# Patient Record
Sex: Female | Born: 1980 | Race: Black or African American | Hispanic: No | Marital: Single | Smoking: Never smoker
Health system: Southern US, Community
[De-identification: ages and names within clinical notes are randomized; demographics above are authoritative.]

## PROBLEM LIST (undated history)

## (undated) DIAGNOSIS — Z8619 Personal history of other infectious and parasitic diseases: Secondary | ICD-10-CM

## (undated) DIAGNOSIS — R569 Unspecified convulsions: Secondary | ICD-10-CM

## (undated) DIAGNOSIS — D649 Anemia, unspecified: Secondary | ICD-10-CM

## (undated) DIAGNOSIS — N83201 Unspecified ovarian cyst, right side: Secondary | ICD-10-CM

## (undated) HISTORY — DX: Personal history of other infectious and parasitic diseases: Z86.19

## (undated) HISTORY — DX: Anemia, unspecified: D64.9

## (undated) HISTORY — DX: Unspecified ovarian cyst, right side: N83.201

---

## 2005-10-17 HISTORY — PX: OTHER SURGICAL HISTORY: SHX169

## 2010-10-17 NOTE — L&D Delivery Note (Signed)
Delivery Note Pt progressed and received an epidural.  She reached complete and pushed very well.  At 10:33 PM a viable and healthy female was delivered via Vaginal, Spontaneous Delivery (Presentation: Right Occiput Anterior).  APGAR: 9, 9; weight pending .   Placenta status: Intact, Spontaneous.  Cord: 3 vessels with the following complications: None.   Anesthesia: Epidural  Episiotomy: None Lacerations: None Est. Blood Loss (mL): 300  Mom to postpartum.  Baby to nursery-stable.  Kreston Ahrendt D 08/01/2011, 10:55 PM

## 2010-11-17 DIAGNOSIS — Z8619 Personal history of other infectious and parasitic diseases: Secondary | ICD-10-CM

## 2010-11-17 HISTORY — DX: Personal history of other infectious and parasitic diseases: Z86.19

## 2010-12-18 ENCOUNTER — Emergency Department (HOSPITAL_COMMUNITY)
Admission: EM | Admit: 2010-12-18 | Discharge: 2010-12-18 | Disposition: A | Discharge status: Home / Self Care | Payer: Medicaid Other | Attending: Emergency Medicine | Admitting: Emergency Medicine

## 2010-12-18 DIAGNOSIS — O209 Hemorrhage in early pregnancy, unspecified: Secondary | ICD-10-CM | POA: Insufficient documentation

## 2010-12-18 LAB — WET PREP, GENITAL
Trich, Wet Prep: NONE SEEN
Yeast Wet Prep HPF POC: NONE SEEN

## 2010-12-18 LAB — URINE MICROSCOPIC-ADD ON

## 2010-12-18 LAB — URINALYSIS, ROUTINE W REFLEX MICROSCOPIC
Bilirubin Urine: NEGATIVE
Glucose, UA: NEGATIVE mg/dL
Ketones, ur: NEGATIVE mg/dL
pH: 6.5 (ref 5.0–8.0)

## 2010-12-20 LAB — GC/CHLAMYDIA PROBE AMP, GENITAL
Chlamydia, DNA Probe: NEGATIVE
GC Probe Amp, Genital: NEGATIVE

## 2011-01-11 LAB — ABO/RH

## 2011-01-11 LAB — RPR: RPR: NONREACTIVE

## 2011-01-11 LAB — HEPATITIS B SURFACE ANTIGEN: Hepatitis B Surface Ag: NEGATIVE

## 2011-07-29 ENCOUNTER — Telehealth (HOSPITAL_COMMUNITY): Payer: Self-pay | Admitting: *Deleted

## 2011-07-29 ENCOUNTER — Encounter (HOSPITAL_COMMUNITY): Payer: Self-pay | Admitting: *Deleted

## 2011-07-29 NOTE — Telephone Encounter (Signed)
Preadmission screen  

## 2011-08-01 ENCOUNTER — Other Ambulatory Visit: Payer: Self-pay | Admitting: Obstetrics and Gynecology

## 2011-08-01 ENCOUNTER — Encounter (HOSPITAL_COMMUNITY): Payer: Self-pay | Admitting: *Deleted

## 2011-08-01 ENCOUNTER — Inpatient Hospital Stay (HOSPITAL_COMMUNITY): Payer: Medicaid Other | Admitting: Anesthesiology

## 2011-08-01 ENCOUNTER — Inpatient Hospital Stay (HOSPITAL_COMMUNITY)
Admission: AD | Admit: 2011-08-01 | Discharge: 2011-08-03 | DRG: 775 | Disposition: A | Discharge status: Home / Self Care | Payer: Medicaid Other | Source: Ambulatory Visit | Attending: Obstetrics and Gynecology | Admitting: Obstetrics and Gynecology

## 2011-08-01 ENCOUNTER — Encounter (HOSPITAL_COMMUNITY): Payer: Self-pay | Admitting: Anesthesiology

## 2011-08-01 DIAGNOSIS — O4290 Premature rupture of membranes, unspecified as to length of time between rupture and onset of labor, unspecified weeks of gestation: Secondary | ICD-10-CM | POA: Diagnosis present

## 2011-08-01 LAB — CBC
HCT: 36.4 % (ref 36.0–46.0)
Hemoglobin: 12.1 g/dL (ref 12.0–15.0)
MCH: 30.7 pg (ref 26.0–34.0)
RBC: 3.94 MIL/uL (ref 3.87–5.11)

## 2011-08-01 LAB — RPR: RPR Ser Ql: NONREACTIVE

## 2011-08-01 MED ORDER — LIDOCAINE HCL 1.5 % IJ SOLN
INTRAMUSCULAR | Status: DC | PRN
  Administered 2011-08-01: 2 mL via INTRADERMAL
  Administered 2011-08-01 (×2): 5 mL via INTRADERMAL

## 2011-08-01 MED ORDER — OXYCODONE-ACETAMINOPHEN 5-325 MG PO TABS: 2.0000 | ORAL_TABLET | ORAL | Status: DC | PRN

## 2011-08-01 MED ORDER — ONDANSETRON HCL 4 MG/2ML IJ SOLN: 4.0000 mg | Freq: Four times a day (QID) | INTRAMUSCULAR | Status: DC | PRN

## 2011-08-01 MED ORDER — LACTATED RINGERS IV SOLN
INTRAVENOUS | Status: DC
  Administered 2011-08-01: 18:00:00 via INTRAVENOUS

## 2011-08-01 MED ORDER — OXYTOCIN BOLUS FROM INFUSION
500.0000 mL | Freq: Once | INTRAVENOUS | Status: DC
  Filled 2011-08-01: qty 500
  Filled 2011-08-01: qty 1000

## 2011-08-01 MED ORDER — PHENYLEPHRINE 40 MCG/ML (10ML) SYRINGE FOR IV PUSH (FOR BLOOD PRESSURE SUPPORT)
80.0000 ug | PREFILLED_SYRINGE | INTRAVENOUS | Status: DC | PRN
Start: 2011-08-01 — End: 2011-08-02
  Filled 2011-08-01: qty 5

## 2011-08-01 MED ORDER — EPHEDRINE 5 MG/ML INJ
10.0000 mg | INTRAVENOUS | Status: DC | PRN
  Filled 2011-08-01: qty 4

## 2011-08-01 MED ORDER — EPHEDRINE 5 MG/ML INJ
10.0000 mg | INTRAVENOUS | Status: DC | PRN
  Filled 2011-08-01 (×2): qty 4

## 2011-08-01 MED ORDER — LIDOCAINE HCL (PF) 1 % IJ SOLN
30.0000 mL | INTRAMUSCULAR | Status: DC | PRN
  Filled 2011-08-01 (×2): qty 30

## 2011-08-01 MED ORDER — OXYTOCIN 20 UNITS IN LACTATED RINGERS INFUSION - SIMPLE: 125.0000 mL/h | Freq: Once | INTRAVENOUS | Status: DC

## 2011-08-01 MED ORDER — DIPHENHYDRAMINE HCL 50 MG/ML IJ SOLN: 12.5000 mg | INTRAMUSCULAR | Status: DC | PRN

## 2011-08-01 MED ORDER — PHENYLEPHRINE 40 MCG/ML (10ML) SYRINGE FOR IV PUSH (FOR BLOOD PRESSURE SUPPORT)
80.0000 ug | PREFILLED_SYRINGE | INTRAVENOUS | Status: DC | PRN
  Filled 2011-08-01 (×2): qty 5

## 2011-08-01 MED ORDER — OXYTOCIN 10 UNIT/ML IJ SOLN
INTRAMUSCULAR | Status: AC
  Filled 2011-08-01: qty 2

## 2011-08-01 MED ORDER — CITRIC ACID-SODIUM CITRATE 334-500 MG/5ML PO SOLN: 30.0000 mL | ORAL | Status: DC | PRN

## 2011-08-01 MED ORDER — LACTATED RINGERS IV SOLN: 500.0000 mL | Freq: Once | INTRAVENOUS | Status: DC

## 2011-08-01 MED ORDER — ACETAMINOPHEN 325 MG PO TABS: 650.0000 mg | ORAL_TABLET | ORAL | Status: DC | PRN

## 2011-08-01 MED ORDER — LACTATED RINGERS IV SOLN: 500.0000 mL | INTRAVENOUS | Status: DC | PRN

## 2011-08-01 MED ORDER — FENTANYL 2.5 MCG/ML BUPIVACAINE 1/10 % EPIDURAL INFUSION (WH - ANES)
14.0000 mL/h | INTRAMUSCULAR | Status: DC
  Administered 2011-08-01: 14 mL/h via EPIDURAL
  Filled 2011-08-01 (×2): qty 60

## 2011-08-01 MED ORDER — IBUPROFEN 600 MG PO TABS
600.0000 mg | ORAL_TABLET | Freq: Four times a day (QID) | ORAL | Status: DC | PRN
  Filled 2011-08-01: qty 1

## 2011-08-01 NOTE — Anesthesia Preprocedure Evaluation (Signed)
Anesthesia Evaluation  Name, MR# and DOB Patient awake  General Assessment Comment  Reviewed: Allergy & Precautions, H&P , NPO status , Patient's Chart, lab work & pertinent test results, reviewed documented beta blocker date and time   History of Anesthesia Complications Negative for: history of anesthetic complications  Airway Mallampati: I TM Distance: >3 FB Neck ROM: full    Dental  (+) Teeth Intact   Pulmonary  clear to auscultation        Cardiovascular regular Normal    Neuro/Psych Negative Neurological ROS  Negative Psych ROS   GI/Hepatic negative GI ROS Neg liver ROS    Endo/Other  Negative Endocrine ROS  Renal/GU negative Renal ROS     Musculoskeletal   Abdominal   Peds  Hematology negative hematology ROS (+)   Anesthesia Other Findings   Reproductive/Obstetrics (+) Pregnancy                           Anesthesia Physical Anesthesia Plan  ASA: II  Anesthesia Plan: Epidural   Post-op Pain Management:    Induction:   Airway Management Planned:   Additional Equipment:   Intra-op Plan:   Post-operative Plan:   Informed Consent: I have reviewed the patients History and Physical, chart, labs and discussed the procedure including the risks, benefits and alternatives for the proposed anesthesia with the patient or authorized representative who has indicated his/her understanding and acceptance.     Plan Discussed with:   Anesthesia Plan Comments:         Anesthesia Quick Evaluation  

## 2011-08-01 NOTE — H&P (Signed)
Linda Pearson is a 30 y.o. female, G3 P1011, EGA [redacted] weeks presenting for ROM.  Started leaking at about 0630 today.  Seen in the office by our NP, ROM confirmed, 3 cm dilated.  Prenatal care essentially uncomplicated, see prenatal records for complete history.    Maternal Medical History:  Reason for admission: Reason for admission: rupture of membranes.  Contractions: Frequency: irregular.   Perceived severity is moderate.    Fetal activity: Perceived fetal activity is normal.    Prenatal complications: no prenatal complications   OB History    Grav Para Term Preterm Abortions TAB SAB Ect Mult Living   3 1 1  1 1    1      Past Medical History  Diagnosis Date  . Anemia   . History of chlamydia 11/2010    negative in 12/2010  . Right ovarian cyst     45x39x33mm dermoid cyst R  . No pertinent past medical history    Past Surgical History  Procedure Date  . Ganglionic cyst 2007    done twice it grew back   Family History: family history includes Diabetes in her father, maternal grandmother, paternal grandfather, and paternal grandmother; Heart attack in her father and maternal grandmother; Heart disease in her father; Hypertension in her father, maternal grandmother, and mother; Kidney disease in her father; Seizures in her father; Sickle cell trait in her cousin; and Stroke in her maternal grandmother.  There is no history of Anesthesia problems, and Hypotension, and Malignant hyperthermia, and Pseudochol deficiency, . Social History:  reports that she has never smoked. She has never used smokeless tobacco. She reports that she does not drink alcohol or use illicit drugs.  Review of Systems  Respiratory: Negative.   Cardiovascular: Negative.    AROM forebag- clear Dilation: 4 Effacement (%): 30 Station: -3 Exam by:: Dr. Jackelyn Knife Blood pressure 139/77, pulse 73, temperature 97.9 F (36.6 C), temperature source Oral, height 5\' 3"  (1.6 m), last menstrual period  10/30/2010. Maternal Exam:  Uterine Assessment: Contraction strength is moderate.  Contraction frequency is regular.   Abdomen: Patient reports no abdominal tenderness. Estimated fetal weight is 7 1/2 lbs.   Fetal presentation: vertex  Introitus: Normal vulva. Normal vagina.    Fetal Exam Fetal Monitor Review: Mode: ultrasound.   Baseline rate: 130s.  Variability: moderate (6-25 bpm).   Pattern: accelerations present and no decelerations.    Fetal State Assessment: Category I - tracings are normal.     Physical Exam  Constitutional: She appears well-developed and well-nourished.  Neck: Neck supple. No thyromegaly present.  Cardiovascular: Normal rate, regular rhythm and normal heart sounds.   No murmur heard. Respiratory: Breath sounds normal. No respiratory distress. She has no wheezes.  GI: Soft.    Prenatal labs: ABO, Rh: O/Positive/-- (03/27 0000) Antibody: Negative (03/27 0000) Rubella: Immune (03/27 0000) RPR: Nonreactive (03/27 0000)  HBsAg: Negative (03/27 0000)  HIV: Non-reactive (03/27 0000)  GBS: Negative (09/21 0000)   Assessment/Plan: IUP at 39 weeks with PROM, now having more regular ctx.  AROM of forebag, will monitor progress.     Chandler Stofer D 08/01/2011, 5:34 PM

## 2011-08-01 NOTE — Anesthesia Procedure Notes (Signed)
Epidural Patient location during procedure: OB Start time: 08/01/2011 6:13 PM Reason for block: procedure for pain  Staffing Performed by: anesthesiologist   Preanesthetic Checklist Completed: patient identified, site marked, surgical consent, pre-op evaluation, timeout performed, IV checked, risks and benefits discussed and monitors and equipment checked  Epidural Patient position: sitting Prep: site prepped and draped and DuraPrep Patient monitoring: continuous pulse ox and blood pressure Approach: midline Injection technique: LOR air  Needle:  Needle type: Tuohy  Needle gauge: 17 G Needle length: 9 cm Catheter type: closed end flexible Catheter size: 19 Gauge Test dose: negative  Assessment Events: blood not aspirated, injection not painful, no injection resistance, negative IV test and no paresthesia  Additional Notes Discussed risk of headache, infection, bleeding, nerve injury and failed or incomplete block.  Patient voices understanding and wishes to proceed.

## 2011-08-02 ENCOUNTER — Inpatient Hospital Stay (HOSPITAL_COMMUNITY)
Admission: RE | Admit: 2011-08-02 | Payer: Medicaid Other | Source: Ambulatory Visit | Admitting: Obstetrics and Gynecology

## 2011-08-02 MED ORDER — DIPHENHYDRAMINE HCL 25 MG PO CAPS: 25.0000 mg | ORAL_CAPSULE | Freq: Four times a day (QID) | ORAL | Status: DC | PRN

## 2011-08-02 MED ORDER — BENZOCAINE-MENTHOL 20-0.5 % EX AERO: 1.0000 "application " | INHALATION_SPRAY | CUTANEOUS | Status: DC | PRN

## 2011-08-02 MED ORDER — MAGNESIUM HYDROXIDE 400 MG/5ML PO SUSP: 30.0000 mL | ORAL | Status: DC | PRN

## 2011-08-02 MED ORDER — MEASLES, MUMPS & RUBELLA VAC ~~LOC~~ INJ
0.5000 mL | INJECTION | Freq: Once | SUBCUTANEOUS | Status: DC
  Filled 2011-08-02: qty 0.5

## 2011-08-02 MED ORDER — METHYLERGONOVINE MALEATE 0.2 MG PO TABS: 0.2000 mg | ORAL_TABLET | ORAL | Status: DC | PRN

## 2011-08-02 MED ORDER — TETANUS-DIPHTH-ACELL PERTUSSIS 5-2.5-18.5 LF-MCG/0.5 IM SUSP
0.5000 mL | Freq: Once | INTRAMUSCULAR | Status: AC
  Administered 2011-08-02: 0.5 mL via INTRAMUSCULAR
  Filled 2011-08-02: qty 0.5

## 2011-08-02 MED ORDER — OXYTOCIN 20 UNITS IN LACTATED RINGERS INFUSION - SIMPLE: 125.0000 mL/h | INTRAVENOUS | Status: DC | PRN

## 2011-08-02 MED ORDER — IBUPROFEN 600 MG PO TABS
600.0000 mg | ORAL_TABLET | Freq: Four times a day (QID) | ORAL | Status: DC
  Administered 2011-08-02 – 2011-08-03 (×4): 600 mg via ORAL
  Filled 2011-08-02 (×4): qty 1

## 2011-08-02 MED ORDER — ONDANSETRON HCL 4 MG PO TABS: 4.0000 mg | ORAL_TABLET | ORAL | Status: DC | PRN

## 2011-08-02 MED ORDER — DIBUCAINE 1 % RE OINT
1.0000 "application " | TOPICAL_OINTMENT | RECTAL | Status: DC | PRN
  Filled 2011-08-02: qty 28

## 2011-08-02 MED ORDER — WITCH HAZEL-GLYCERIN EX PADS: 1.0000 "application " | MEDICATED_PAD | CUTANEOUS | Status: DC | PRN

## 2011-08-02 MED ORDER — SIMETHICONE 80 MG PO CHEW: 80.0000 mg | CHEWABLE_TABLET | ORAL | Status: DC | PRN

## 2011-08-02 MED ORDER — ONDANSETRON HCL 4 MG/2ML IJ SOLN: 4.0000 mg | INTRAMUSCULAR | Status: DC | PRN

## 2011-08-02 MED ORDER — LANOLIN HYDROUS EX OINT: TOPICAL_OINTMENT | CUTANEOUS | Status: DC | PRN

## 2011-08-02 MED ORDER — PRENATAL PLUS 27-1 MG PO TABS
1.0000 | ORAL_TABLET | Freq: Every day | ORAL | Status: DC
  Administered 2011-08-02 – 2011-08-03 (×2): 1 via ORAL
  Filled 2011-08-02 (×2): qty 1

## 2011-08-02 MED ORDER — OXYCODONE-ACETAMINOPHEN 5-325 MG PO TABS
1.0000 | ORAL_TABLET | ORAL | Status: DC | PRN
  Administered 2011-08-02: 1 via ORAL
  Filled 2011-08-02: qty 1

## 2011-08-02 MED ORDER — SENNOSIDES-DOCUSATE SODIUM 8.6-50 MG PO TABS
2.0000 | ORAL_TABLET | Freq: Every day | ORAL | Status: DC
  Administered 2011-08-02: 2 via ORAL

## 2011-08-02 MED ORDER — IBUPROFEN 600 MG PO TABS
600.0000 mg | ORAL_TABLET | Freq: Four times a day (QID) | ORAL | Status: DC
  Administered 2011-08-02: 600 mg via ORAL

## 2011-08-02 MED ORDER — BENZOCAINE-MENTHOL 20-0.5 % EX AERO
INHALATION_SPRAY | CUTANEOUS | Status: AC
  Administered 2011-08-02: 02:00:00
  Filled 2011-08-02: qty 56

## 2011-08-02 MED ORDER — ZOLPIDEM TARTRATE 5 MG PO TABS: 5.0000 mg | ORAL_TABLET | Freq: Every evening | ORAL | Status: DC | PRN

## 2011-08-02 MED ORDER — METHYLERGONOVINE MALEATE 0.2 MG/ML IJ SOLN: 0.2000 mg | INTRAMUSCULAR | Status: DC | PRN

## 2011-08-02 NOTE — Anesthesia Postprocedure Evaluation (Signed)
  Anesthesia Post-op Note  Patient: Linda Pearson  Procedure(s) Performed: * No procedures listed *  Patient Location: Mother/Baby  Anesthesia Type: Epidural  Level of Consciousness: awake, alert  and oriented  Airway and Oxygen Therapy: Patient Spontanous Breathing  Post-op Pain: mild  Post-op Assessment: Patient's Cardiovascular Status Stable, Respiratory Function Stable, Patent Airway, Adequate PO intake and Pain level controlled  Post-op Vital Signs: stable  Complications: No apparent anesthesia complications

## 2011-08-02 NOTE — Progress Notes (Signed)
UR chart review completed.  

## 2011-08-02 NOTE — Progress Notes (Signed)
PPD#1 Doing well Afeb, VSS Continue routine postpartum care

## 2011-08-03 MED ORDER — OXYCODONE-ACETAMINOPHEN 5-325 MG PO TABS: 1.0000 | ORAL_TABLET | Freq: Four times a day (QID) | ORAL | Status: AC | PRN

## 2011-08-03 MED ORDER — IBUPROFEN 600 MG PO TABS: 600.0000 mg | ORAL_TABLET | Freq: Four times a day (QID) | ORAL | Status: AC

## 2011-08-03 NOTE — Discharge Summary (Signed)
Obstetric Discharge Summary Reason for Admission: rupture of membranes Prenatal Procedures: none Intrapartum Procedures: spontaneous vaginal delivery Postpartum Procedures: none Complications-Operative and Postpartum: none Hemoglobin  Date Value Range Status  08/01/2011 12.1  12.0-15.0 (g/dL) Final     HCT  Date Value Range Status  08/01/2011 36.4  36.0-46.0 (%) Final    Discharge Diagnoses: Term Pregnancy-delivered  Discharge Information: Date: 08/03/2011 Activity: pelvic rest Diet: routine Medications: Ibuprofen and Percocet Condition: stable Instructions: refer to practice specific booklet Discharge to: home Follow-up Information    Follow up with Linda Pearson D, MD. Make an appointment in 6 weeks.   Contact information:   [address], [address] [place] [address] [phone]          Newborn Data: Live born female  Birth Weight: 8 lb 3.2 oz (3720 g) APGAR: 9, 9  Home with mother.  Cj Beecher D 08/03/2011, 9:06 AM

## 2011-08-03 NOTE — Progress Notes (Signed)
PPD#2 Doing well Afeb, VSS Fundus- firm, NT at U+1 Discharge home

## 2012-01-23 ENCOUNTER — Encounter (HOSPITAL_COMMUNITY): Payer: Self-pay | Admitting: Pharmacist

## 2012-02-02 SURGERY — Surgical Case
Anesthesia: *Unknown

## 2012-02-06 ENCOUNTER — Encounter (HOSPITAL_COMMUNITY): Payer: Self-pay

## 2012-02-06 ENCOUNTER — Encounter (HOSPITAL_COMMUNITY)
Admission: RE | Admit: 2012-02-06 | Discharge: 2012-02-06 | Disposition: A | Discharge status: Home / Self Care | Payer: Medicaid Other | Source: Ambulatory Visit | Attending: Obstetrics and Gynecology | Admitting: Obstetrics and Gynecology

## 2012-02-06 HISTORY — DX: Unspecified convulsions: R56.9

## 2012-02-06 LAB — CBC
Hemoglobin: 12.9 g/dL (ref 12.0–15.0)
MCH: 29.9 pg (ref 26.0–34.0)
MCV: 91.4 fL (ref 78.0–100.0)
RBC: 4.32 MIL/uL (ref 3.87–5.11)
WBC: 7.4 10*3/uL (ref 4.0–10.5)

## 2012-02-06 NOTE — Patient Instructions (Signed)
YOUR PROCEDURE IS SCHEDULED ON:Fri - 02/10/12  ENTER THROUGH THE MAIN ENTRANCE OF [facility] AT:6am  USE DESK PHONE AND DIAL [phone] TO INFORM us OF YOUR ARRIVAL  CALL [phone] IF YOU HAVE ANY QUESTIONS OR PROBLEMS PRIOR TO YOUR ARRIVAL.  REMEMBER: DO NOT EAT OR DRINK AFTER MIDNIGHT :Thursday   YOU MAY BRUSH YOUR TEETH THE MORNING OF SURGERY   TAKE THESE MEDICINES THE DAY OF SURGERY WITH SIP OF WATER:none   DO NOT WEAR JEWELRY, EYE MAKEUP, LIPSTICK OR DARK FINGERNAIL POLISH DO NOT WEAR LOTIONS  DO NOT SHAVE FOR 48 HOURS PRIOR TO SURGERY  YOU WILL NOT BE ALLOWED TO DRIVE YOURSELF HOME.  NAME OF DRIVER:Lazaar

## 2012-02-09 NOTE — H&P (Signed)
Linda Pearson is an 31 y.o. female. She had an SVD last September, had a probable dermoid during the pregnancy.  Follow-up ultrasound after pregnancy reveals a probable 4-5 cm right dermoid.  She has minimal if any pain.  She has had trouble getting Medicaid, but now has it and is ready to have surgery to remove this probable dermoid.  Pertinent Gynecological History: OB History: G3, P2012 SVD at term x 2, EAB x 1   Menstrual History: Menarche age: 36 No LMP recorded.    Past Medical History  Diagnosis Date  . History of chlamydia 11/2010    negative in 12/2010  . Right ovarian cyst     45x39x5mm dermoid cyst R  . Anemia     history of anemia- > one yr  . Seizures     as infant- febrile    Past Surgical History  Procedure Date  . Ganglionic cyst 2007    done twice it grew back    Family History  Problem Relation Age of Onset  . Anesthesia problems Neg Hx   . Hypotension Neg Hx   . Malignant hyperthermia Neg Hx   . Pseudochol deficiency Neg Hx   . Hypertension Mother   . Diabetes Father   . Heart attack Father   . Heart disease Father   . Kidney disease Father   . Seizures Father   . Hypertension Father   . Diabetes Maternal Grandmother   . Heart attack Maternal Grandmother   . Stroke Maternal Grandmother   . Hypertension Maternal Grandmother   . Diabetes Paternal Grandmother   . Diabetes Paternal Grandfather   . Sickle cell trait Cousin     Social History:  reports that she has never smoked. She has never used smokeless tobacco. She reports that she does not drink alcohol or use illicit drugs.  Allergies:  Allergies  Allergen Reactions  . Latex Other (See Comments)    Reaction tingling feeling, burning  . Percocet (Oxycodone-Acetaminophen) Itching    No prescriptions prior to admission    Review of Systems  Respiratory: Negative.   Cardiovascular: Negative.   Gastrointestinal: Negative.   Genitourinary: Negative.     currently  breastfeeding. Physical Exam  Constitutional: She appears well-developed and well-nourished.  Neck: Neck supple. No thyromegaly present.  Cardiovascular: Normal rate, regular rhythm and normal heart sounds.   No murmur heard. Respiratory: Effort normal and breath sounds normal. No respiratory distress. She has no wheezes.  GI: Soft. She exhibits no distension and no mass. There is no tenderness.  Genitourinary: Vagina normal.       Uterus midplanar to retroverted, normal size Fullness right adnexa, no mass on left, slightly tender on right    No results found for this or any previous visit (from the past 24 hour(s)).  No results found.  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  We have discussed the need to surgically remove this mass.  All surgical options and risks have been discussed.  Will admit for open laparoscopy and attempt at righ ovarian cystectomy.  She understands I may have to remove the entire ovary and that she may require a mini-lap to do so.  Yovana Scogin D 02/09/2012, 7:40 PM

## 2012-02-10 ENCOUNTER — Ambulatory Visit (HOSPITAL_COMMUNITY): Payer: Medicaid Other | Admitting: Anesthesiology

## 2012-02-10 ENCOUNTER — Ambulatory Visit (HOSPITAL_COMMUNITY)
Admission: RE | Admit: 2012-02-10 | Discharge: 2012-02-10 | Disposition: A | Discharge status: Home / Self Care | Payer: Medicaid Other | Source: Ambulatory Visit | Attending: Obstetrics and Gynecology | Admitting: Obstetrics and Gynecology

## 2012-02-10 ENCOUNTER — Encounter (HOSPITAL_COMMUNITY): Payer: Self-pay | Admitting: *Deleted

## 2012-02-10 ENCOUNTER — Encounter (HOSPITAL_COMMUNITY): Payer: Self-pay | Admitting: Anesthesiology

## 2012-02-10 ENCOUNTER — Encounter (HOSPITAL_COMMUNITY)
Admission: RE | Disposition: A | Discharge status: Home / Self Care | Payer: Self-pay | Source: Ambulatory Visit | Attending: Obstetrics and Gynecology

## 2012-02-10 DIAGNOSIS — D279 Benign neoplasm of unspecified ovary: Secondary | ICD-10-CM | POA: Diagnosis present

## 2012-02-10 DIAGNOSIS — Z01812 Encounter for preprocedural laboratory examination: Secondary | ICD-10-CM | POA: Insufficient documentation

## 2012-02-10 DIAGNOSIS — Z01818 Encounter for other preprocedural examination: Secondary | ICD-10-CM | POA: Insufficient documentation

## 2012-02-10 HISTORY — PX: LAPAROSCOPY: SHX197

## 2012-02-10 HISTORY — PX: SALPINGOOPHORECTOMY: SHX82

## 2012-02-10 SURGERY — LAPAROSCOPY OPERATIVE
Anesthesia: General | Site: Abdomen | Laterality: Right | Wound class: Clean Contaminated

## 2012-02-10 MED ORDER — MEPERIDINE HCL 25 MG/ML IJ SOLN
6.2500 mg | INTRAMUSCULAR | Status: DC | PRN
  Administered 2012-02-10: 12.5 mg via INTRAVENOUS

## 2012-02-10 MED ORDER — ROCURONIUM BROMIDE 100 MG/10ML IV SOLN
INTRAVENOUS | Status: DC | PRN
  Administered 2012-02-10: 25 mg via INTRAVENOUS

## 2012-02-10 MED ORDER — GLYCOPYRROLATE 0.2 MG/ML IJ SOLN
INTRAMUSCULAR | Status: DC | PRN
  Administered 2012-02-10: 0.1 mg via INTRAVENOUS
  Administered 2012-02-10: 0.3 mg via INTRAVENOUS

## 2012-02-10 MED ORDER — FENTANYL CITRATE 0.05 MG/ML IJ SOLN
INTRAMUSCULAR | Status: AC
  Administered 2012-02-10: 50 ug via INTRAVENOUS
  Filled 2012-02-10: qty 2

## 2012-02-10 MED ORDER — LACTATED RINGERS IR SOLN
Status: DC | PRN
  Administered 2012-02-10: 3000 mL

## 2012-02-10 MED ORDER — ROCURONIUM BROMIDE 50 MG/5ML IV SOLN
INTRAVENOUS | Status: AC
  Filled 2012-02-10: qty 1

## 2012-02-10 MED ORDER — BUPIVACAINE HCL (PF) 0.25 % IJ SOLN
INTRAMUSCULAR | Status: DC | PRN
  Administered 2012-02-10: 11 mL

## 2012-02-10 MED ORDER — GLYCOPYRROLATE 0.2 MG/ML IJ SOLN
INTRAMUSCULAR | Status: AC
  Filled 2012-02-10: qty 1

## 2012-02-10 MED ORDER — ONDANSETRON HCL 4 MG/2ML IJ SOLN
INTRAMUSCULAR | Status: AC
  Filled 2012-02-10: qty 2

## 2012-02-10 MED ORDER — LIDOCAINE HCL (CARDIAC) 20 MG/ML IV SOLN
INTRAVENOUS | Status: AC
  Filled 2012-02-10: qty 5

## 2012-02-10 MED ORDER — DEXAMETHASONE SODIUM PHOSPHATE 4 MG/ML IJ SOLN
INTRAMUSCULAR | Status: DC | PRN
  Administered 2012-02-10: 10 mg via INTRAVENOUS

## 2012-02-10 MED ORDER — HYDROCODONE-ACETAMINOPHEN 5-325 MG PO TABS
ORAL_TABLET | ORAL | Status: AC
  Filled 2012-02-10: qty 1

## 2012-02-10 MED ORDER — LIDOCAINE HCL (CARDIAC) 20 MG/ML IV SOLN
INTRAVENOUS | Status: DC | PRN
  Administered 2012-02-10: 20 mg via INTRAVENOUS
  Administered 2012-02-10: 30 mg via INTRAVENOUS

## 2012-02-10 MED ORDER — DIPHENHYDRAMINE HCL 50 MG/ML IJ SOLN
INTRAMUSCULAR | Status: AC
  Administered 2012-02-10: 12.5 mg via INTRAVENOUS
  Filled 2012-02-10: qty 1

## 2012-02-10 MED ORDER — DIPHENHYDRAMINE HCL 50 MG/ML IJ SOLN
12.5000 mg | Freq: Once | INTRAMUSCULAR | Status: AC
  Administered 2012-02-10: 12.5 mg via INTRAVENOUS

## 2012-02-10 MED ORDER — MIDAZOLAM HCL 2 MG/2ML IJ SOLN
INTRAMUSCULAR | Status: AC
  Filled 2012-02-10: qty 2

## 2012-02-10 MED ORDER — ONDANSETRON HCL 4 MG/2ML IJ SOLN
INTRAMUSCULAR | Status: DC | PRN
  Administered 2012-02-10: 4 mg via INTRAVENOUS

## 2012-02-10 MED ORDER — DEXAMETHASONE SODIUM PHOSPHATE 10 MG/ML IJ SOLN
INTRAMUSCULAR | Status: AC
  Filled 2012-02-10: qty 1

## 2012-02-10 MED ORDER — LACTATED RINGERS IV SOLN
INTRAVENOUS | Status: DC
  Administered 2012-02-10: 07:00:00 via INTRAVENOUS

## 2012-02-10 MED ORDER — METHYLENE BLUE 1 % INJ SOLN
INTRAMUSCULAR | Status: AC
  Filled 2012-02-10: qty 1

## 2012-02-10 MED ORDER — NEOSTIGMINE METHYLSULFATE 1 MG/ML IJ SOLN
INTRAMUSCULAR | Status: DC | PRN
  Administered 2012-02-10: 3 mg via INTRAVENOUS

## 2012-02-10 MED ORDER — FENTANYL CITRATE 0.05 MG/ML IJ SOLN
25.0000 ug | INTRAMUSCULAR | Status: DC | PRN
  Administered 2012-02-10 (×4): 50 ug via INTRAVENOUS

## 2012-02-10 MED ORDER — FENTANYL CITRATE 0.05 MG/ML IJ SOLN
INTRAMUSCULAR | Status: AC
  Filled 2012-02-10: qty 5

## 2012-02-10 MED ORDER — MIDAZOLAM HCL 5 MG/5ML IJ SOLN
INTRAMUSCULAR | Status: DC | PRN
  Administered 2012-02-10 (×2): 1 mg via INTRAVENOUS

## 2012-02-10 MED ORDER — HYDROCODONE-ACETAMINOPHEN 5-325 MG PO TABS
1.0000 | ORAL_TABLET | Freq: Once | ORAL | Status: AC
  Administered 2012-02-10: 1 via ORAL

## 2012-02-10 MED ORDER — LACTATED RINGERS IV SOLN
INTRAVENOUS | Status: DC
  Administered 2012-02-10 (×2): via INTRAVENOUS

## 2012-02-10 MED ORDER — HYDROCODONE-ACETAMINOPHEN 5-500 MG PO TABS: 1.0000 | ORAL_TABLET | ORAL | Status: AC | PRN

## 2012-02-10 MED ORDER — NEOSTIGMINE METHYLSULFATE 1 MG/ML IJ SOLN
INTRAMUSCULAR | Status: AC
  Filled 2012-02-10: qty 10

## 2012-02-10 MED ORDER — PROPOFOL 10 MG/ML IV EMUL
INTRAVENOUS | Status: AC
  Filled 2012-02-10: qty 20

## 2012-02-10 MED ORDER — BUPIVACAINE HCL (PF) 0.25 % IJ SOLN
INTRAMUSCULAR | Status: AC
  Filled 2012-02-10: qty 30

## 2012-02-10 MED ORDER — FENTANYL CITRATE 0.05 MG/ML IJ SOLN
INTRAMUSCULAR | Status: DC | PRN
  Administered 2012-02-10: 100 ug via INTRAVENOUS
  Administered 2012-02-10 (×3): 50 ug via INTRAVENOUS

## 2012-02-10 MED ORDER — KETOROLAC TROMETHAMINE 30 MG/ML IJ SOLN: 15.0000 mg | Freq: Once | INTRAMUSCULAR | Status: DC | PRN

## 2012-02-10 MED ORDER — KETOROLAC TROMETHAMINE 30 MG/ML IJ SOLN
INTRAMUSCULAR | Status: DC | PRN
  Administered 2012-02-10: 30 mg via INTRAVENOUS

## 2012-02-10 MED ORDER — PROPOFOL 10 MG/ML IV EMUL
INTRAVENOUS | Status: DC | PRN
  Administered 2012-02-10: 170 mg via INTRAVENOUS

## 2012-02-10 MED ORDER — PROMETHAZINE HCL 25 MG/ML IJ SOLN: 6.2500 mg | INTRAMUSCULAR | Status: DC | PRN

## 2012-02-10 MED ORDER — KETOROLAC TROMETHAMINE 30 MG/ML IJ SOLN
INTRAMUSCULAR | Status: AC
  Filled 2012-02-10: qty 1

## 2012-02-10 MED ORDER — MEPERIDINE HCL 25 MG/ML IJ SOLN
INTRAMUSCULAR | Status: AC
  Administered 2012-02-10: 12.5 mg via INTRAVENOUS
  Filled 2012-02-10: qty 1

## 2012-02-10 SURGICAL SUPPLY — 56 items
CABLE HIGH FREQUENCY MONO STRZ (ELECTRODE) IMPLANT
CANISTER SUCTION 2500CC (MISCELLANEOUS) IMPLANT
CATH FOLEY 3WAY  5CC 16FR (CATHETERS)
CATH FOLEY 3WAY 5CC 16FR (CATHETERS) IMPLANT
CATH ROBINSON RED A/P 16FR (CATHETERS) ×4 IMPLANT
CHLORAPREP W/TINT 26ML (MISCELLANEOUS) ×4 IMPLANT
CLOTH BEACON ORANGE TIMEOUT ST (SAFETY) ×4 IMPLANT
CONTAINER PREFILL 10% NBF 15ML (MISCELLANEOUS) IMPLANT
DECANTER SPIKE VIAL GLASS SM (MISCELLANEOUS) ×8 IMPLANT
DERMABOND ADVANCED (GAUZE/BANDAGES/DRESSINGS) ×1
DERMABOND ADVANCED .7 DNX12 (GAUZE/BANDAGES/DRESSINGS) ×3 IMPLANT
DRAIN CHANNEL 19F RND (DRAIN) IMPLANT
DRSG VASELINE 3X18 (GAUZE/BANDAGES/DRESSINGS) IMPLANT
ELECT BLADE 6 FLAT ULTRCLN (ELECTRODE) IMPLANT
FILTER STRAW FLUID ASPIR (MISCELLANEOUS) IMPLANT
GAUZE SPONGE 4X4 12PLY STRL LF (GAUZE/BANDAGES/DRESSINGS) IMPLANT
GAUZE SPONGE 4X4 16PLY XRAY LF (GAUZE/BANDAGES/DRESSINGS) ×4 IMPLANT
GLOVE BIO SURGEON STRL SZ8 (GLOVE) ×4 IMPLANT
GLOVE ORTHO TXT STRL SZ7.5 (GLOVE) ×4 IMPLANT
GOWN PREVENTION PLUS LG XLONG (DISPOSABLE) ×24 IMPLANT
NEEDLE EPID 17G 5 ECHO TUOHY (NEEDLE) IMPLANT
NEEDLE HYPO 25X1 1.5 SAFETY (NEEDLE) IMPLANT
NEEDLE INSUFFLATION 14GA 120MM (NEEDLE) ×4 IMPLANT
NS IRRIG 1000ML POUR BTL (IV SOLUTION) IMPLANT
PACK ABDOMINAL GYN (CUSTOM PROCEDURE TRAY) IMPLANT
PACK LAPAROSCOPY BASIN (CUSTOM PROCEDURE TRAY) ×4 IMPLANT
PAD OB MATERNITY 4.3X12.25 (PERSONAL CARE ITEMS) IMPLANT
PLUG CATH AND CAP STER (CATHETERS) IMPLANT
POUCH SPECIMEN RETRIEVAL 10MM (ENDOMECHANICALS) ×4 IMPLANT
PROTECTOR NERVE ULNAR (MISCELLANEOUS) ×4 IMPLANT
SCALPEL HARMONIC ACE (MISCELLANEOUS) ×4 IMPLANT
SET CYSTO W/LG BORE CLAMP LF (SET/KITS/TRAYS/PACK) IMPLANT
SET IRRIG TUBING LAPAROSCOPIC (IRRIGATION / IRRIGATOR) ×4 IMPLANT
SLEEVE ADV FIXATION 5X100MM (TROCAR) ×4 IMPLANT
SOLUTION ELECTROLUBE (MISCELLANEOUS) IMPLANT
SPONGE LAP 18X18 X RAY DECT (DISPOSABLE) IMPLANT
STAPLER VISISTAT 35W (STAPLE) IMPLANT
SUT PDS AB 0 CTX 60 (SUTURE) IMPLANT
SUT PROLENE 4 0 KS NEEDLE (SUTURE) IMPLANT
SUT SILK 2 0 FSL 18 (SUTURE) IMPLANT
SUT VIC AB 0 CT1 27 (SUTURE)
SUT VIC AB 0 CT1 27XBRD ANBCTR (SUTURE) IMPLANT
SUT VIC AB 3-0 CTX 36 (SUTURE) IMPLANT
SUT VICRYL 0 TIES 12 18 (SUTURE) IMPLANT
SUT VICRYL 0 UR6 27IN ABS (SUTURE) ×4 IMPLANT
SUT VICRYL 4-0 PS2 18IN ABS (SUTURE) ×4 IMPLANT
SYR 3ML LL SCALE MARK (SYRINGE) IMPLANT
SYR CONTROL 10ML LL (SYRINGE) IMPLANT
TOWEL OR 17X24 6PK STRL BLUE (TOWEL DISPOSABLE) ×8 IMPLANT
TRAY FOLEY BAG SILVER LF 14FR (CATHETERS) IMPLANT
TRAY FOLEY CATH 14FR (SET/KITS/TRAYS/PACK) IMPLANT
TROCAR HASSON GELL 12X100 (TROCAR) ×4 IMPLANT
TROCAR Z-THREAD FIOS 11X100 BL (TROCAR) IMPLANT
TROCAR Z-THREAD FIOS 5X100MM (TROCAR) ×4 IMPLANT
WARMER LAPAROSCOPE (MISCELLANEOUS) ×4 IMPLANT
WATER STERILE IRR 1000ML POUR (IV SOLUTION) IMPLANT

## 2012-02-10 NOTE — Consult Note (Signed)
Lactation consultation note; PACU nurse paged to see mother. Mother has 58 month old exclusively breatfed infant. Mother was told by MD to pump and dump breastmilk for 24 hrs. Mother was given hand pump. Mother was concerned about wasting breastmilk. All mothers medications were reviewed in Medication and Mothers Milk . Mother informed that she only need to pump and dump times one and then ok to breastfeed. Mother was given lactation services number for more assistance as needed.

## 2012-02-10 NOTE — Op Note (Signed)
Preoperative diagnosis: Right ovarian dermoid Postoperative diagnosis: Right ovarian dermoid Procedure: Laparoscopy with right salpingo-oophorectomy Surgeon: Lavina Hamman M.D. Assistant: Huel Cote M.D. Anesthesia: Gen. Endotracheal tube Findings: She had a 5-6 cm right ovary with no identifiable normal ovarian tissue. The remainder of her pelvis looked normal. The Hulka tenaculum was just about perforating the anterior fundus. Estimated blood loss: Minimal Complications: None Specimens: Right tube and ovary  Procedure in detail:  The patient was taken to the operating room placed in the dorsosupine position. Her left arm was tucked to her side. General anesthesia was induced. Legs were placed in mobile stirrups. Abdomen perineum and vagina were then prepped and draped in usual sterile fashion, bladder drained with a red Robinson catheter, Hulka tenaculum applied to the cervix for uterine manipulation. Infraumbilical skin was then infiltrated with quarter percent Marcaine and a 3 cm horizontal incision was made. The fascia was identified and elevated. Fascia was incised with scissors. Peritoneal cavity was then entered bluntly. Fascia incision was extended bilaterally for about 1 cm on each side. A pursestring suture of 0 Vicryl was then placed around the fascia. A Hassan cannula was inserted and laparoscope inserted and good visualization was achieved with insufflation of CO2. Inspection revealed the above-mentioned findings. The remainder of her abdomen and pelvis were normal. I did not feel that there was significant ovarian tissue to preserve so I elected to proceed with salpingo-oophorectomy. 5 mm ports were placed on each side under direct visualization. I used a grasper from the left side to grab the distal right fallopian tube. The Harmonic scalpel Ace was then used from the right side to take down the infundibulopelvic ligament, mesosalpinx, right utero-ovarian ligament and come across  the proximal tube. This freed up the right tube and ovary. Bleeding from pedicles were controlled with the harmonic scalpel Ace. A 5 mm scope was then introduced and an Endobag was inserted through the umbilical trocar. The tube and ovary were scooped up in the Endobag and brought to the umbilical incision. The trocar was removed. The ovary was just a little too big to remove the way the fascial incision was. The bag was opened and an incision was made in the ovary so that some of the sebaceous material with hair to drain. I was then able to remove the ovary in the bag through this incision. The trocar was then reintroduced. Abdomen was reinsufflated. Inspection revealed a small amount of bleeding from the right utero-ovarian pedicle and this was controlled with the harmonic scalpel. All other pedicles were hemostatic. At the beginning of the case when it looked like the Hulka tenaculum was about to perforate the anterior uterus it was removed. No significant bleeding was noted in the anterior fundus. 5 mm ports were removed with direct visualization.  All gas was allowed to deflate from the abdomen and the umbilical trocar was removed. The pursestring suture was closed. This achieved fair closure of this fascial incision. 2 figure-of-eight sutures of 0 Vicryl were then placed to reinforce this. Skin incisions were then closed with interrupted subcuticular sutures of 4-0 Vicryl followed by Dermabond. The patient was then awakened in the operating room and taken to the recovery in stable condition after tolerating the procedure well. Counts were correct and she had PAS hose on throughout the procedure.

## 2012-02-10 NOTE — Anesthesia Postprocedure Evaluation (Signed)
  Anesthesia Post-op Note  Patient: Linda Pearson  Procedure(s) Performed: Procedure(s) (LRB): LAPAROSCOPY OPERATIVE (N/A) SALPINGO OOPHERECTOMY (Right)  Patient Location: PACU  Anesthesia Type: General  Level of Consciousness: awake, alert  and oriented  Airway and Oxygen Therapy: Patient Spontanous Breathing  Post-op Pain: mild  Post-op Assessment: Post-op Vital signs reviewed, Patient's Cardiovascular Status Stable, Respiratory Function Stable, Patent Airway, No signs of Nausea or vomiting and Pain level controlled  Post-op Vital Signs: Reviewed and stable  Complications: No apparent anesthesia complications

## 2012-02-10 NOTE — Transfer of Care (Signed)
Immediate Anesthesia Transfer of Care Note  Patient: Linda Pearson  Procedure(s) Performed: Procedure(s) (LRB): LAPAROSCOPY OPERATIVE (N/A) SALPINGO OOPHERECTOMY (Right)  Patient Location: PACU  Anesthesia Type: General  Level of Consciousness: awake  Airway & Oxygen Therapy: Patient Spontanous Breathing and Patient connected to nasal cannula oxygen  Post-op Assessment: Report given to PACU RN, Post -op Vital signs reviewed and stable and Patient moving all extremities  Post vital signs: Reviewed and stable  Complications: No apparent anesthesia complications

## 2012-02-10 NOTE — Discharge Instructions (Signed)
Routine instructions for laparoscopyDISCHARGE INSTRUCTIONS: Laparoscopy  The following instructions have been prepared to help you care for yourself upon your return home today.  Wound care: Marland Kitchen Do not get the incision wet for the first 24 hours. The incision should be kept clean and dry. . The Band-Aids or dressings may be removed the day after surgery. . Should the incision become sore, red, and swollen after the first week, check with your doctor.  Personal hygiene: . Shower the day after your procedure.  Activity and limitations: . Do NOT drive or operate any equipment today. . Do NOT lift anything more than 15 pounds for 2-3 weeks after surgery. . Do NOT rest in bed all day. . Walking is encouraged. Walk each day, starting slowly with 5-minute walks 3 or 4 times a day. Slowly increase the length of your walks. . Walk up and down stairs slowly. . Do NOT do strenuous activities, such as golfing, playing tennis, bowling, running, biking, weight lifting, gardening, mowing, or vacuuming for 2-4 weeks. Ask your doctor when it is okay to start.  Diet: Eat a light meal as desired this evening. You may resume your usual diet tomorrow.  Return to work: This is dependent on the type of work you do. For the most part you can return to a desk job within a week of surgery. If you are more active at work, please discuss this with your doctor.  What to expect after your surgery: You may have a slight burning sensation when you urinate on the first day. You may have a very small amount of blood in the urine. Expect to have a small amount of vaginal discharge/light bleeding for 1-2 weeks. It is not unusual to have abdominal soreness and bruising for up to 2 weeks. You may be tired and need more rest for about 1 week. You may experience shoulder pain for 24-72 hours. Lying flat in bed may relieve it.  Call your doctor for any of the following: . Develop a fever of 100.4 or greater . Inability to urinate  6 hours after discharge from hospital . Severe pain not relieved by pain medications . Persistent of heavy bleeding at incision site . Redness or swelling around incision site after a week . Increasing nausea or vomiting  Patient Signature________________________________________ Nurse Signature_________________________________________ DISCHARGE INSTRUCTIONS: Laparoscopy  The following instructions have been prepared to help you care for yourself upon your return home today.  Wound care: Marland Kitchen Do not get the incision wet for the first 24 hours. The incision should be kept clean and dry. . The Band-Aids or dressings may be removed the day after surgery. . Should the incision become sore, red, and swollen after the first week, check with your doctor.  Personal hygiene: . Shower the day after your procedure.  Activity and limitations: . Do NOT drive or operate any equipment today. . Do NOT lift anything more than 15 pounds for 2-3 weeks after surgery. . Do NOT rest in bed all day. . Walking is encouraged. Walk each day, starting slowly with 5-minute walks 3 or 4 times a day. Slowly increase the length of your walks. . Walk up and down stairs slowly. . Do NOT do strenuous activities, such as golfing, playing tennis, bowling, running, biking, weight lifting, gardening, mowing, or vacuuming for 2-4 weeks. Ask your doctor when it is okay to start.  Diet: Eat a light meal as desired this evening. You may resume your usual diet tomorrow.  Return to work:  This is dependent on the type of work you do. For the most part you can return to a desk job within a week of surgery. If you are more active at work, please discuss this with your doctor.  What to expect after your surgery: You may have a slight burning sensation when you urinate on the first day. You may have a very small amount of blood in the urine. Expect to have a small amount of vaginal discharge/light bleeding for 1-2 weeks. It is not  unusual to have abdominal soreness and bruising for up to 2 weeks. You may be tired and need more rest for about 1 week. You may experience shoulder pain for 24-72 hours. Lying flat in bed may relieve it.  Call your doctor for any of the following: . Develop a fever of 100.4 or greater . Inability to urinate 6 hours after discharge from hospital . Severe pain not relieved by pain medications . Persistent of heavy bleeding at incision site . Redness or swelling around incision site after a week . Increasing nausea or vomiting  Patient Signature________________________________________ Nurse Signature_________________________________________

## 2012-02-10 NOTE — Anesthesia Preprocedure Evaluation (Signed)
Anesthesia Evaluation  Patient identified by MRN, date of birth, ID band Patient awake    Reviewed: Allergy & Precautions, H&P , NPO status , Patient's Chart, lab work & pertinent test results  Airway Mallampati: I TM Distance: >3 FB Neck ROM: full    Dental No notable dental hx. (+) Teeth Intact   Pulmonary neg pulmonary ROS,    Pulmonary exam normal       Cardiovascular negative cardio ROS      Neuro/Psych negative psych ROS   GI/Hepatic negative GI ROS, Neg liver ROS,   Endo/Other  negative endocrine ROS  Renal/GU negative Renal ROS  negative genitourinary   Musculoskeletal negative musculoskeletal ROS (+)   Abdominal Normal abdominal exam  (+)   Peds negative pediatric ROS (+)  Hematology negative hematology ROS (+)   Anesthesia Other Findings   Reproductive/Obstetrics negative OB ROS                           Anesthesia Physical Anesthesia Plan  ASA: II  Anesthesia Plan: General   Post-op Pain Management:    Induction: Intravenous  Airway Management Planned: Oral ETT  Additional Equipment:   Intra-op Plan:   Post-operative Plan: Extubation in OR  Informed Consent: I have reviewed the patients History and Physical, chart, labs and discussed the procedure including the risks, benefits and alternatives for the proposed anesthesia with the patient or authorized representative who has indicated his/her understanding and acceptance.   Dental Advisory Given  Plan Discussed with: CRNA and Surgeon  Anesthesia Plan Comments:         Anesthesia Quick Evaluation  

## 2012-02-10 NOTE — Interval H&P Note (Signed)
History and Physical Interval Note:  02/10/2012 7:11 AM  Linda Pearson  has presented today for surgery, with the diagnosis of right dermoid  The various methods of treatment have been discussed with the patient and family. After consideration of risks, benefits and other options for treatment, the patient has consented to  Procedure(s) (LRB): LAPAROSCOPY OPERATIVE (N/A) EXPLORATORY LAPAROTOMY (N/A) SALPINGO OOPHERECTOMY (Right) as a surgical intervention .  The patients' history has been reviewed, patient examined, no change in status, stable for surgery.  I have reviewed the patients' chart and labs.  Questions were answered to the patient's satisfaction.     Deisi Salonga D

## 2012-02-13 ENCOUNTER — Encounter (HOSPITAL_COMMUNITY): Payer: Self-pay | Admitting: Obstetrics and Gynecology

## 2014-08-18 ENCOUNTER — Encounter (HOSPITAL_COMMUNITY): Payer: Self-pay | Admitting: Obstetrics and Gynecology

## 2015-04-13 ENCOUNTER — Emergency Department
Admission: EM | Admit: 2015-04-13 | Discharge: 2015-04-13 | Disposition: A | Discharge status: Home / Self Care | Payer: No Typology Code available for payment source | Attending: Emergency Medicine | Admitting: Emergency Medicine

## 2015-04-13 ENCOUNTER — Emergency Department: Payer: No Typology Code available for payment source

## 2015-04-13 ENCOUNTER — Encounter: Payer: Self-pay | Admitting: Emergency Medicine

## 2015-04-13 DIAGNOSIS — S61311A Laceration without foreign body of left index finger with damage to nail, initial encounter: Secondary | ICD-10-CM | POA: Insufficient documentation

## 2015-04-13 DIAGNOSIS — Y93H2 Activity, gardening and landscaping: Secondary | ICD-10-CM | POA: Diagnosis not present

## 2015-04-13 DIAGNOSIS — S61213A Laceration without foreign body of left middle finger without damage to nail, initial encounter: Secondary | ICD-10-CM

## 2015-04-13 DIAGNOSIS — Z91040 Latex allergy status: Secondary | ICD-10-CM | POA: Diagnosis not present

## 2015-04-13 DIAGNOSIS — Y92007 Garden or yard of unspecified non-institutional (private) residence as the place of occurrence of the external cause: Secondary | ICD-10-CM | POA: Insufficient documentation

## 2015-04-13 DIAGNOSIS — Y998 Other external cause status: Secondary | ICD-10-CM | POA: Insufficient documentation

## 2015-04-13 DIAGNOSIS — S61313A Laceration without foreign body of left middle finger with damage to nail, initial encounter: Secondary | ICD-10-CM | POA: Diagnosis not present

## 2015-04-13 DIAGNOSIS — W293XXA Contact with powered garden and outdoor hand tools and machinery, initial encounter: Secondary | ICD-10-CM | POA: Insufficient documentation

## 2015-04-13 DIAGNOSIS — S61211A Laceration without foreign body of left index finger without damage to nail, initial encounter: Secondary | ICD-10-CM

## 2015-04-13 DIAGNOSIS — S6992XA Unspecified injury of left wrist, hand and finger(s), initial encounter: Secondary | ICD-10-CM | POA: Diagnosis present

## 2015-04-13 MED ORDER — HYDROCODONE-ACETAMINOPHEN 5-325 MG PO TABS: 1.0000 | ORAL_TABLET | Freq: Three times a day (TID) | ORAL | Status: DC | PRN

## 2015-04-13 MED ORDER — IBUPROFEN 800 MG PO TABS: 800.0000 mg | ORAL_TABLET | Freq: Three times a day (TID) | ORAL | Status: DC | PRN

## 2015-04-13 MED ORDER — TETANUS-DIPHTH-ACELL PERTUSSIS 5-2.5-18.5 LF-MCG/0.5 IM SUSP
0.5000 mL | Freq: Once | INTRAMUSCULAR | Status: AC
  Administered 2015-04-13: 0.5 mL via INTRAMUSCULAR

## 2015-04-13 MED ORDER — HYDROCODONE-ACETAMINOPHEN 5-325 MG PO TABS
1.0000 | ORAL_TABLET | Freq: Once | ORAL | Status: AC
  Administered 2015-04-13: 1 via ORAL

## 2015-04-13 MED ORDER — CEPHALEXIN 500 MG PO CAPS: 500.0000 mg | ORAL_CAPSULE | Freq: Four times a day (QID) | ORAL | Status: AC

## 2015-04-13 MED ORDER — BACITRACIN ZINC 500 UNIT/GM EX OINT
TOPICAL_OINTMENT | CUTANEOUS | Status: AC
  Filled 2015-04-13: qty 0.9

## 2015-04-13 MED ORDER — BACITRACIN 500 UNIT/GM EX OINT
1.0000 | TOPICAL_OINTMENT | Freq: Two times a day (BID) | CUTANEOUS | Status: DC
Start: 2015-04-13 — End: 2015-04-13
  Administered 2015-04-13: 1 via TOPICAL

## 2015-04-13 MED ORDER — LIDOCAINE HCL (PF) 1 % IJ SOLN
INTRAMUSCULAR | Status: AC
  Filled 2015-04-13: qty 5

## 2015-04-13 MED ORDER — TETANUS-DIPHTH-ACELL PERTUSSIS 5-2.5-18.5 LF-MCG/0.5 IM SUSP
INTRAMUSCULAR | Status: AC
  Administered 2015-04-13: 0.5 mL via INTRAMUSCULAR
  Filled 2015-04-13: qty 0.5

## 2015-04-13 MED ORDER — HYDROCODONE-ACETAMINOPHEN 5-325 MG PO TABS
ORAL_TABLET | ORAL | Status: AC
  Administered 2015-04-13: 1 via ORAL
  Filled 2015-04-13: qty 1

## 2015-04-13 MED ORDER — LIDOCAINE HCL (PF) 1 % IJ SOLN: 10.0000 mL | Freq: Once | INTRAMUSCULAR | Status: DC

## 2015-04-13 MED ORDER — LIDOCAINE HCL (PF) 1 % IJ SOLN
INTRAMUSCULAR | Status: AC
  Filled 2015-04-13: qty 15

## 2015-04-13 NOTE — Discharge Instructions (Signed)
Take medication as prescribed. Keep clean and dry. Clean daily with soap and water then apply topical antibiotic ointment such as Neosporin.  Return to the ER in 7-10 days for suture removal. Return to the ER sooner for increased pain, redness, swelling or drainage, new or worsening concerns.  Laceration Care, Adult A laceration is a cut or lesion that goes through all layers of the skin and into the tissue just beneath the skin. TREATMENT  Some lacerations may not require closure. Some lacerations may not be able to be closed due to an increased risk of infection. It is important to see your caregiver as soon as possible after an injury to minimize the risk of infection and maximize the opportunity for successful closure. If closure is appropriate, pain medicines may be given, if needed. The wound will be cleaned to help prevent infection. Your caregiver will use stitches (sutures), staples, wound glue (adhesive), or skin adhesive strips to repair the laceration. These tools bring the skin edges together to allow for faster healing and a better cosmetic outcome. However, all wounds will heal with a scar. Once the wound has healed, scarring can be minimized by covering the wound with sunscreen during the day for 1 full year. HOME CARE INSTRUCTIONS  For sutures or staples:  Keep the wound clean and dry.  If you were given a bandage (dressing), you should change it at least once a day. Also, change the dressing if it becomes wet or dirty, or as directed by your caregiver.  Wash the wound with soap and water 2 times a day. Rinse the wound off with water to remove all soap. Pat the wound dry with a clean towel.  After cleaning, apply a thin layer of the antibiotic ointment as recommended by your caregiver. This will help prevent infection and keep the dressing from sticking.  You may shower as usual after the first 24 hours. Do not soak the wound in water until the sutures are removed.  Only take  over-the-counter or prescription medicines for pain, discomfort, or fever as directed by your caregiver.  Get your sutures or staples removed as directed by your caregiver. For skin adhesive strips:  Keep the wound clean and dry.  Do not get the skin adhesive strips wet. You may bathe carefully, using caution to keep the wound dry.  If the wound gets wet, pat it dry with a clean towel.  Skin adhesive strips will fall off on their own. You may trim the strips as the wound heals. Do not remove skin adhesive strips that are still stuck to the wound. They will fall off in time. For wound adhesive:  You may briefly wet your wound in the shower or bath. Do not soak or scrub the wound. Do not swim. Avoid periods of heavy perspiration until the skin adhesive has fallen off on its own. After showering or bathing, gently pat the wound dry with a clean towel.  Do not apply liquid medicine, cream medicine, or ointment medicine to your wound while the skin adhesive is in place. This may loosen the film before your wound is healed.  If a dressing is placed over the wound, be careful not to apply tape directly over the skin adhesive. This may cause the adhesive to be pulled off before the wound is healed.  Avoid prolonged exposure to sunlight or tanning lamps while the skin adhesive is in place. Exposure to ultraviolet light in the first year will darken the scar.  The  skin adhesive will usually remain in place for 5 to 10 days, then naturally fall off the skin. Do not pick at the adhesive film. You may need a tetanus shot if:  You cannot remember when you had your last tetanus shot.  You have never had a tetanus shot. If you get a tetanus shot, your arm may swell, get red, and feel warm to the touch. This is common and not a problem. If you need a tetanus shot and you choose not to have one, there is a rare chance of getting tetanus. Sickness from tetanus can be serious. SEEK MEDICAL CARE IF:   You  have redness, swelling, or increasing pain in the wound.  You see a red line that goes away from the wound.  You have yellowish-white fluid (pus) coming from the wound.  You have a fever.  You notice a bad smell coming from the wound or dressing.  Your wound breaks open before or after sutures have been removed.  You notice something coming out of the wound such as wood or glass.  Your wound is on your hand or foot and you cannot move a finger or toe. SEEK IMMEDIATE MEDICAL CARE IF:   Your pain is not controlled with prescribed medicine.  You have severe swelling around the wound causing pain and numbness or a change in color in your arm, hand, leg, or foot.  Your wound splits open and starts bleeding.  You have worsening numbness, weakness, or loss of function of any joint around or beyond the wound.  You develop painful lumps near the wound or on the skin anywhere on your body. MAKE SURE YOU:   Understand these instructions.  Will watch your condition.  Will get help right away if you are not doing well or get worse. Document Released: 10/03/2005 Document Revised: 12/26/2011 Document Reviewed: 03/29/2011 [facility] Patient Information 2015 [place], [place]. This information is not intended to replace advice given to you by your health care provider. Make sure you discuss any questions you have with your health care provider.

## 2015-04-13 NOTE — ED Provider Notes (Signed)
[facility] Emergency Department Provider Note  ____________________________________________  Time seen: Approximately 5:28 PM  I have reviewed the triage vital signs and the nursing notes.   HISTORY  Chief Complaint Extremity Laceration   HPI Linda Pearson is a 34 y.o. female presents to the ER for the complaint of left second and third finger lacerations. Patient reports that prior to arrival she was at home and she was using a motorized hedge trimmer cutting the hedges and states that she was guarding it with her left hand and accidentally hit her left hand. Patient reports lacerations to distal second and third fingers. Denies other fall or injury. Reports she still has sensation to those fingers.  States current pain is 6 out of 10 and aching. Denies pain radiation. Denies other pain or injury. Denies numbness or tingling sensation. Reports still has full range of motion in hand. Reports unsure of last tetanus immunization.   Past Medical History  Diagnosis Date  . History of chlamydia 11/2010    negative in 12/2010  . Right ovarian cyst     [id number] dermoid cyst R  . Anemia     history of anemia- > one yr  . Seizures     as infant- febrile    Patient Active Problem List   Diagnosis Date Noted  . 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 Ganglionic cyst  2007    done twice it grew back  . Laparoscopy  02/10/2012    Procedure: LAPAROSCOPY OPERATIVE;  Surgeon: Cheri Fowler, MD;  Location: [facility];  Service: Gynecology;  Laterality: N/A;  . Salpingoophorectomy  02/10/2012    Procedure: SALPINGO OOPHERECTOMY;  Surgeon: Cheri Fowler, MD;  Location: [facility];  Service: Gynecology;  Laterality: Right;   Left upper arm nexplanon: states not sexually active  No current outpatient prescriptions on file.  Allergies Latex and Percocet  Family History  Problem Relation Age of Onset  . Anesthesia  problems Neg Hx   . Hypotension Neg Hx   . Malignant hyperthermia Neg Hx   . Pseudochol deficiency Neg Hx   . Hypertension Mother   . Diabetes Father   . Heart attack Father   . Heart disease Father   . Kidney disease Father   . Seizures Father   . Hypertension Father   . Diabetes Maternal Grandmother   . Heart attack Maternal Grandmother   . Stroke Maternal Grandmother   . Hypertension Maternal Grandmother   . Diabetes Paternal Grandmother   . Diabetes Paternal Grandfather   . Sickle cell trait Cousin     Social History History  Substance Use Topics  . Smoking status: Never Smoker   . Smokeless tobacco: Never Used  . Alcohol Use: No    Review of Systems Constitutional: No fever/chills Eyes: No visual changes. ENT: No sore throat. Cardiovascular: Denies chest pain. Respiratory: Denies shortness of breath. Gastrointestinal: No abdominal pain.  No nausea, no vomiting.  No diarrhea.  No constipation. Genitourinary: Negative for dysuria. Musculoskeletal: Negative for back pain. Skin: Negative for rash. Laceration to left second and third fingers. Neurological: Negative for headaches, focal weakness or numbness.  10-point ROS otherwise negative.  ____________________________________________   PHYSICAL EXAM:  VITAL SIGNS: ED Triage Vitals  Enc Vitals Group     BP 04/13/15 1650 150/92 mmHg     Pulse Rate 04/13/15 1650 110     Resp 04/13/15 1650 20     Temp  04/13/15 1650 99 F (37.2 C)     Temp src --      SpO2 04/13/15 1650 100 %     Weight 04/13/15 1650 201 lb (91.173 kg)     Height 04/13/15 1650 5\' 3"  (1.6 m)     Head Cir --      Peak Flow --      Pain Score 04/13/15 1703 5     Pain Loc --      Pain Edu? --      Excl. in [place]? --    Blood pressure 124/82, pulse 70, temperature 99 F (37.2 C), resp. rate 18, height 5\' 3"  (1.6 m), weight 201 lb (91.173 kg), last menstrual period 03/18/2015, SpO2 98 %, not currently breastfeeding.   Constitutional: Alert  and oriented. Well appearing and in no acute distress. Eyes: Conjunctivae are normal. PERRL. EOMI. Head: Atraumatic. Nose: No congestion/rhinnorhea. Mouth/Throat: Mucous membranes are moist.  Oropharynx non-erythematous. Neck: No stridor.  No cervical spine tenderness to palpation. Hematological/Lymphatic/Immunilogical: No cervical lymphadenopathy. Cardiovascular: Normal rate, regular rhythm. Grossly normal heart sounds.  Good peripheral circulation. Respiratory: Normal respiratory effort.  No retractions. Lungs CTAB. Gastrointestinal: Soft and nontender. No distention. No abdominal bruits. No CVA tenderness. Musculoskeletal: No lower extremity tenderness nor edema.  No joint effusions. No cervical, thoracic or lumbar tenderness to palpation. Full range of motion changes positions quickly without difficulty or distress.  Except: Left hand second and third distal fingers with laceration present and with mild to moderate tender to palpation, No tendon deficit, No neurological or motor deficits, Sensation intact. Neurologic:  Normal speech and language. No gross focal neurologic deficits are appreciated. Speech is normal. No gait instability. Skin:  Skin is warm, dry and intact. No rash noted. Except: Left hand second distal phalanx with 1 cm laceration through the distal tip of finger with partial nail avulsion as well as 2 cm superficial laceration to distal second finger palmar surface. Left hand third distal palmar surface finger 2 cm laceration with jagged edges, no active bleeding. Psychiatric: Mood and affect are normal. Speech and behavior are normal.  ____________________________________________   LABS (all labs ordered are listed, but only abnormal results are displayed)  Labs Reviewed - No data to display ____________________________________________  RADIOLOGY  LEFT HAND - COMPLETE 3+ VIEW  COMPARISON: None.  FINDINGS: There is no evidence of fracture or dislocation. There  is no evidence of arthropathy or other focal bone abnormality. Soft tissues are unremarkable.  IMPRESSION: Normal left hand.   Electronically Signed  By: Marijo Conception, M.D.  On: 04/13/2015 18:18  I, Marylene Land, personally viewed and evaluated these images as part of my medical decision making.  ____________________________________________   PROCEDURES  Procedure(s) performed:  LACERATION REPAIR 1. Performed by: Marylene Land Authorized by: Marylene Land Consent: Verbal consent obtained. Risks and benefits: risks, benefits and alternatives were discussed Consent given by: patient Patient identity confirmed: provided demographic data Prepped and Draped in normal sterile fashion Wound explored Laceration Location: left 2 finger nail Laceration Length: 1 cm No Foreign Bodies seen or palpated Anesthesia: digital block Local anesthetic: lidocaine 1% Anesthetic total: 4 ml Irrigation method: syringe Amount of cleaning: standard Skin closure: 5-0 nylon 5-0 vicryl  Number of sutures: 1 distal fingertip, 1 beneath nail  Surgical adhesive also used to to adhere nail fragment to protect nailbed Technique: simple interrupted.  No foreign body found Patient tolerance: Patient tolerated the procedure well with no immediate complications.  2. Performed by: Marylene Land  Authorized by: Marylene Land Consent: Verbal consent obtained. Risks and benefits: risks, benefits and alternatives were discussed Consent given by: patient Patient identity confirmed: provided demographic data Prepped and Draped in normal sterile fashion Wound explored Laceration Location: left 2 finger  Laceration Length: 2 cm No Foreign Bodies seen or palpated Anesthesia: digital block Local anesthetic: lidocaine 1% Anesthetic total: 4 ml Irrigation method: syringe Amount of cleaning: standard Skin closure: 5-0 nylon Number of sutures: 2 distal finger Technique: simple interrupted.   No foreign body found Patient tolerance: Patient tolerated the procedure well with no immediate complications  3.Performed by: Marylene Land Authorized by: Marylene Land Consent: Verbal consent obtained. Risks and benefits: risks, benefits and alternatives were discussed Consent given by: patient Patient identity confirmed: provided demographic data Prepped and Draped in normal sterile fashion Wound explored Laceration Location: left 3 finger  Laceration Length: 2 cm No Foreign Bodies seen or palpated Anesthesia:local anesthesia Local anesthetic: lidocaine 1% Anesthetic total: 3 ml Irrigation method: syringe Wound edges modified Amount of cleaning: standard Skin closure: 5-0 nylon Number of sutures: 4  Technique: simple interrupted.  No foreign body found Patient tolerance: Patient tolerated the procedure well with no immediate complications ____________________________________________   INITIAL IMPRESSION / ASSESSMENT AND PLAN / ED COURSE  Pertinent labs & imaging results that were available during my care of the patient were reviewed by me and considered in my medical decision making (see chart for details).  Very well-appearing. No acute distress. Left hand second and third distal phalanx fractures. X-ray negative for bony abnormality or foreign body. Laceration repaired. Patient tolerated well. Tetanus updated today in ER.  Will treat patient with oral cephalexin as injury sustained from outdoor hedge tremor as well as when necessary pain medication. Discussed strict follow-up and return parameters. Return in 7-10 days for suture removal. ____________________________________________   FINAL CLINICAL IMPRESSION(S) / ED DIAGNOSES  Final diagnoses:  Laceration of second finger, left, initial encounter  Laceration of third finger, left, initial encounter      Marylene Land, NP 04/13/15 1945  Orbie Pyo, MD 04/13/15 2237

## 2015-04-13 NOTE — ED Notes (Signed)
Patient with no complaints at this time. Respirations even and unlabored. Skin warm/dry. Discharge instructions reviewed with patient at this time. Patient given opportunity to voice concerns/ask questions. Patient discharged at this time and left Emergency Department with steady gait.   

## 2015-04-13 NOTE — ED Notes (Signed)
Laceration to left index and 2 nd finger from a hedge clipper

## 2015-04-13 NOTE — ED Notes (Signed)
Fingers soaking in NS and betadine

## 2015-04-22 ENCOUNTER — Emergency Department
Admission: EM | Admit: 2015-04-22 | Discharge: 2015-04-22 | Disposition: A | Discharge status: Home / Self Care | Payer: No Typology Code available for payment source | Attending: Emergency Medicine | Admitting: Emergency Medicine

## 2015-04-22 ENCOUNTER — Encounter: Payer: Self-pay | Admitting: Emergency Medicine

## 2015-04-22 DIAGNOSIS — Z91040 Latex allergy status: Secondary | ICD-10-CM | POA: Insufficient documentation

## 2015-04-22 DIAGNOSIS — Z4802 Encounter for removal of sutures: Secondary | ICD-10-CM | POA: Insufficient documentation

## 2015-04-22 DIAGNOSIS — Z792 Long term (current) use of antibiotics: Secondary | ICD-10-CM | POA: Diagnosis not present

## 2015-04-22 NOTE — ED Notes (Signed)
Here for suture removal

## 2015-04-22 NOTE — ED Provider Notes (Signed)
[facility] Emergency Department Provider Note  ____________________________________________  Time seen: Approximately 3:30 PM  I have reviewed the triage vital signs and the nursing notes.   HISTORY  Chief Complaint Suture / Staple Removal    HPI Linda Pearson is a 34 y.o. female who presents for evaluation and suture removal. Denies any complaints at this time states feeling fingers feeling great.   Past Medical History  Diagnosis Date  . History of chlamydia 11/2010    negative in 12/2010  . Right ovarian cyst     45x39x53mm dermoid cyst R  . Anemia     history of anemia- > one yr  . Seizures     as infant- febrile    Patient Active Problem List   Diagnosis Date Noted  . 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 Ganglionic cyst  2007    done twice it grew back  . Laparoscopy  02/10/2012    Procedure: LAPAROSCOPY OPERATIVE;  Surgeon: Cheri Fowler, MD;  Location: [facility];  Service: Gynecology;  Laterality: N/A;  . Salpingoophorectomy  02/10/2012    Procedure: SALPINGO OOPHERECTOMY;  Surgeon: Cheri Fowler, MD;  Location: [facility];  Service: Gynecology;  Laterality: Right;    Current Outpatient Rx  Name  Route  Sig  Dispense  Refill  . cephALEXin (KEFLEX) 500 MG capsule   Oral   Take 1 capsule (500 mg total) by mouth 4 (four) times daily.   40 capsule   0   . HYDROcodone-acetaminophen (NORCO/VICODIN) 5-325 MG per tablet   Oral   Take 1 tablet by mouth every 8 (eight) hours as needed for moderate pain or severe pain (do not drive or operate machinery while taking as can cause drowsiness).   9 tablet   0   . ibuprofen (ADVIL,MOTRIN) 800 MG tablet   Oral   Take 1 tablet (800 mg total) by mouth every 8 (eight) hours as needed for mild pain or moderate pain.   15 tablet   0     Allergies Latex and Percocet  Family History  Problem Relation Age of Onset  . Anesthesia problems Neg Hx    . Hypotension Neg Hx   . Malignant hyperthermia Neg Hx   . Pseudochol deficiency Neg Hx   . Hypertension Mother   . Diabetes Father   . Heart attack Father   . Heart disease Father   . Kidney disease Father   . Seizures Father   . Hypertension Father   . Diabetes Maternal Grandmother   . Heart attack Maternal Grandmother   . Stroke Maternal Grandmother   . Hypertension Maternal Grandmother   . Diabetes Paternal Grandmother   . Diabetes Paternal Grandfather   . Sickle cell trait Cousin     Social History History  Substance Use Topics  . Smoking status: Never Smoker   . Smokeless tobacco: Never Used  . Alcohol Use: No    Review of Systems Constitutional: No fever/chills Eyes: No visual changes. ENT: No sore throat. Cardiovascular: Denies chest pain. Respiratory: Denies shortness of breath. Gastrointestinal: No abdominal pain.  No nausea, no vomiting.  No diarrhea.  No constipation. Genitourinary: Negative for dysuria. Musculoskeletal: Negative for back pain. Skin: Negative for rash. Intact well-healed laceration. Neurological: Negative for headaches, focal weakness or numbness.  10-point ROS otherwise negative.  ____________________________________________   PHYSICAL EXAM:  VITAL SIGNS: ED Triage Vitals  Enc Vitals Group  BP 04/22/15 1529 129/76 mmHg     Pulse Rate 04/22/15 1529 83     Resp 04/22/15 1529 18     Temp 04/22/15 1529 99.3 F (37.4 C)     Temp Source 04/22/15 1529 Oral     SpO2 04/22/15 1529 99 %     Weight 04/22/15 1526 200 lb (90.719 kg)     Height 04/22/15 1526 5\' 4"  (1.626 m)     Head Cir --      Peak Flow --      Pain Score --      Pain Loc --      Pain Edu? --      Excl. in [place]? --     Constitutional: Alert and oriented. Well appearing and in no acute distress. Musculoskeletal: No lower extremity tenderness nor edema.  No joint effusions. Neurologic:  Normal speech and language. No gross focal neurologic deficits are appreciated.  Speech is normal. No gait instability. Skin:  Skin is warm, dry and intact. No rash noted. Laceration well-healed edges approximated. Psychiatric: Mood and affect are normal. Speech and behavior are normal.  ____________________________________________   LABS (all labs ordered are listed, but only abnormal results are displayed)  Labs Reviewed - No data to display ____________________________________________   PROCEDURES  Procedure(s) performed: None  Critical Care performed: No  ____________________________________________   INITIAL IMPRESSION / ASSESSMENT AND PLAN / ED COURSE  Pertinent labs & imaging results that were available during my care of the patient were reviewed by me and considered in my medical decision making (see chart for details).  Laceration removal. No complaints. Patient follow-up as needed. Patient voices no other emergency medical complaints at this visit. ____________________________________________   FINAL CLINICAL IMPRESSION(S) / ED DIAGNOSES  Final diagnoses:  Encounter for removal of sutures      Arlyss Repress, PA-C 04/22/15 [place], PA-C 04/22/15 1537  Ahmed Prima, MD 04/23/15 [phone]

## 2015-04-22 NOTE — Discharge Instructions (Signed)

## 2015-05-04 ENCOUNTER — Other Ambulatory Visit: Payer: Self-pay | Admitting: Nurse Practitioner

## 2015-05-04 ENCOUNTER — Ambulatory Visit
Admission: RE | Admit: 2015-05-04 | Discharge: 2015-05-04 | Disposition: A | Discharge status: Home / Self Care | Payer: No Typology Code available for payment source | Source: Ambulatory Visit | Attending: Nurse Practitioner | Admitting: Nurse Practitioner

## 2015-05-04 DIAGNOSIS — K929 Disease of digestive system, unspecified: Secondary | ICD-10-CM | POA: Diagnosis not present

## 2015-05-04 DIAGNOSIS — R1031 Right lower quadrant pain: Secondary | ICD-10-CM | POA: Diagnosis not present

## 2015-05-04 MED ORDER — IOHEXOL 300 MG/ML  SOLN
100.0000 mL | Freq: Once | INTRAMUSCULAR | Status: AC | PRN
  Administered 2015-05-04: 100 mL via INTRAVENOUS

## 2015-05-04 MED ORDER — IOHEXOL 240 MG/ML SOLN
50.0000 mL | Freq: Once | INTRAMUSCULAR | Status: AC | PRN
  Administered 2015-05-04: 50 mL via ORAL

## 2015-05-06 ENCOUNTER — Ambulatory Visit (INDEPENDENT_AMBULATORY_CARE_PROVIDER_SITE_OTHER): Payer: No Typology Code available for payment source | Admitting: General Surgery

## 2015-05-06 ENCOUNTER — Encounter: Payer: Self-pay | Admitting: General Surgery

## 2015-05-06 VITALS — BP 122/76 | HR 82 | Resp 12 | Ht 63.0 in | Wt 197.0 lb

## 2015-05-06 DIAGNOSIS — G8929 Other chronic pain: Secondary | ICD-10-CM | POA: Diagnosis not present

## 2015-05-06 DIAGNOSIS — R1031 Right lower quadrant pain: Secondary | ICD-10-CM | POA: Diagnosis not present

## 2015-05-06 NOTE — Patient Instructions (Signed)

## 2015-05-06 NOTE — Progress Notes (Addendum)
Patient ID: Linda Pearson, female   DOB: 07-25-1981, 34 y.o.   MRN: [id number]  Chief Complaint  Patient presents with  . Abdominal Pain    HPI [name] is a 34 y.o. female here today for a evaluation of abdominal pain, early appendicitis vs colitis. A CT scan was done on 05-04-15. Patient states she has been having abdominal pain in her right lower quadrant for six days now. She states when she walks the pain hurts more. Patient states that she has not eaten much because it makes her nauseous.    HPI  Past Medical History  Diagnosis Date  . History of chlamydia 11/2010    negative in 12/2010  . Right ovarian cyst     45x39x7mm dermoid cyst R  . Anemia     history of anemia- > one yr  . Seizures     as infant- febrile    Past Surgical History  Procedure Laterality Date  . Ganglionic cyst  2007    done twice it grew back  . Laparoscopy  02/10/2012    Procedure: LAPAROSCOPY OPERATIVE;  Surgeon: Cheri Fowler, MD;  Location: [facility];  Service: Gynecology;  Laterality: N/A;  . Salpingoophorectomy  02/10/2012    Procedure: SALPINGO OOPHERECTOMY;  Surgeon: Cheri Fowler, MD;  Location: [facility];  Service: Gynecology;  Laterality: Right;    Family History  Problem Relation Age of Onset  . Anesthesia problems Neg Hx   . Hypotension Neg Hx   . Malignant hyperthermia Neg Hx   . Pseudochol deficiency Neg Hx   . Hypertension Mother   . Diabetes Father   . Heart attack Father   . Heart disease Father   . Kidney disease Father   . Seizures Father   . Hypertension Father   . Diabetes Maternal Grandmother   . Heart attack Maternal Grandmother   . Stroke Maternal Grandmother   . Hypertension Maternal Grandmother   . Diabetes Paternal Grandmother   . Diabetes Paternal Grandfather   . Sickle cell trait Cousin     Social History History  Substance Use Topics  . Smoking status: Never Smoker   . Smokeless tobacco: Never Used  . Alcohol Use: No    Allergies  Allergen Reactions   . Latex Other (See Comments)    Reaction tingling feeling, burning  . Percocet [Oxycodone-Acetaminophen] Itching    Current Outpatient Prescriptions  Medication Sig Dispense Refill  . ciprofloxacin (CIPRO) 500 MG tablet     . dicyclomine (BENTYL) 10 MG capsule     . ibuprofen (ADVIL,MOTRIN) 800 MG tablet Take 1 tablet (800 mg total) by mouth every 8 (eight) hours as needed for mild pain or moderate pain. 15 tablet 0  . metroNIDAZOLE (FLAGYL) 500 MG tablet      No current facility-administered medications for this visit.    Review of Systems Review of Systems  Constitutional: Negative.   Respiratory: Negative.   Cardiovascular: Negative.   Gastrointestinal: Positive for nausea and abdominal pain.    Blood pressure 122/76, pulse 82, resp. rate 12, height 5\' 3"  (1.6 m), weight 197 lb (89.359 kg), last menstrual period 03/18/2015.  Physical Exam Physical Exam  Constitutional: She is oriented to person, place, and time. She appears well-developed and well-nourished.  Eyes: Conjunctivae are normal. No scleral icterus.  Neck: Neck supple.  Cardiovascular: Normal rate, regular rhythm and normal heart sounds.   Pulmonary/Chest: Effort normal and breath sounds normal.  Abdominal: Soft. Normal appearance and bowel sounds are normal. There  is tenderness in the right lower quadrant.  Lymphadenopathy:    She has no cervical adenopathy.  Neurological: She is alert and oriented to person, place, and time.  Skin: Skin is warm and dry.    Data Reviewed CT scan reviewed and showed findings suggested of inflammation of the lateral ascending colon. The appendix itself does not appear inflammed. Labs were normal.   Assessment    Right lower quadrant pain likely due to infectious colitis of ascending colon.    Plan    Continue course of antibiotics. Call if symptoms worsen or develop a fever. Follow up in one week.      PCP:  No Pcp Per Patient Ref Vira Browns 05/11/2015, 8:10 AM

## 2015-05-11 ENCOUNTER — Encounter: Payer: Self-pay | Admitting: General Surgery

## 2015-05-12 ENCOUNTER — Ambulatory Visit: Payer: No Typology Code available for payment source | Admitting: General Surgery

## 2015-06-23 ENCOUNTER — Encounter: Payer: Self-pay | Admitting: *Deleted

## 2016-05-16 ENCOUNTER — Ambulatory Visit (HOSPITAL_COMMUNITY)
Admission: EM | Admit: 2016-05-16 | Discharge: 2016-05-16 | Discharge status: Absent without leave | Payer: BLUE CROSS/BLUE SHIELD

## 2016-11-01 IMAGING — CR DG HAND COMPLETE 3+V*L*
1 series · 3 of 3 positions shown · non-contrast
Comparison: None.

CLINICAL DATA: Left index finger laceration and pain with Harald
clippers. Initial encounter.

EXAM:
LEFT HAND - COMPLETE 3+ VIEW

[Series 1: lat · 0.17mm/px · 3 of 3 slices shown]
[im 1/3]
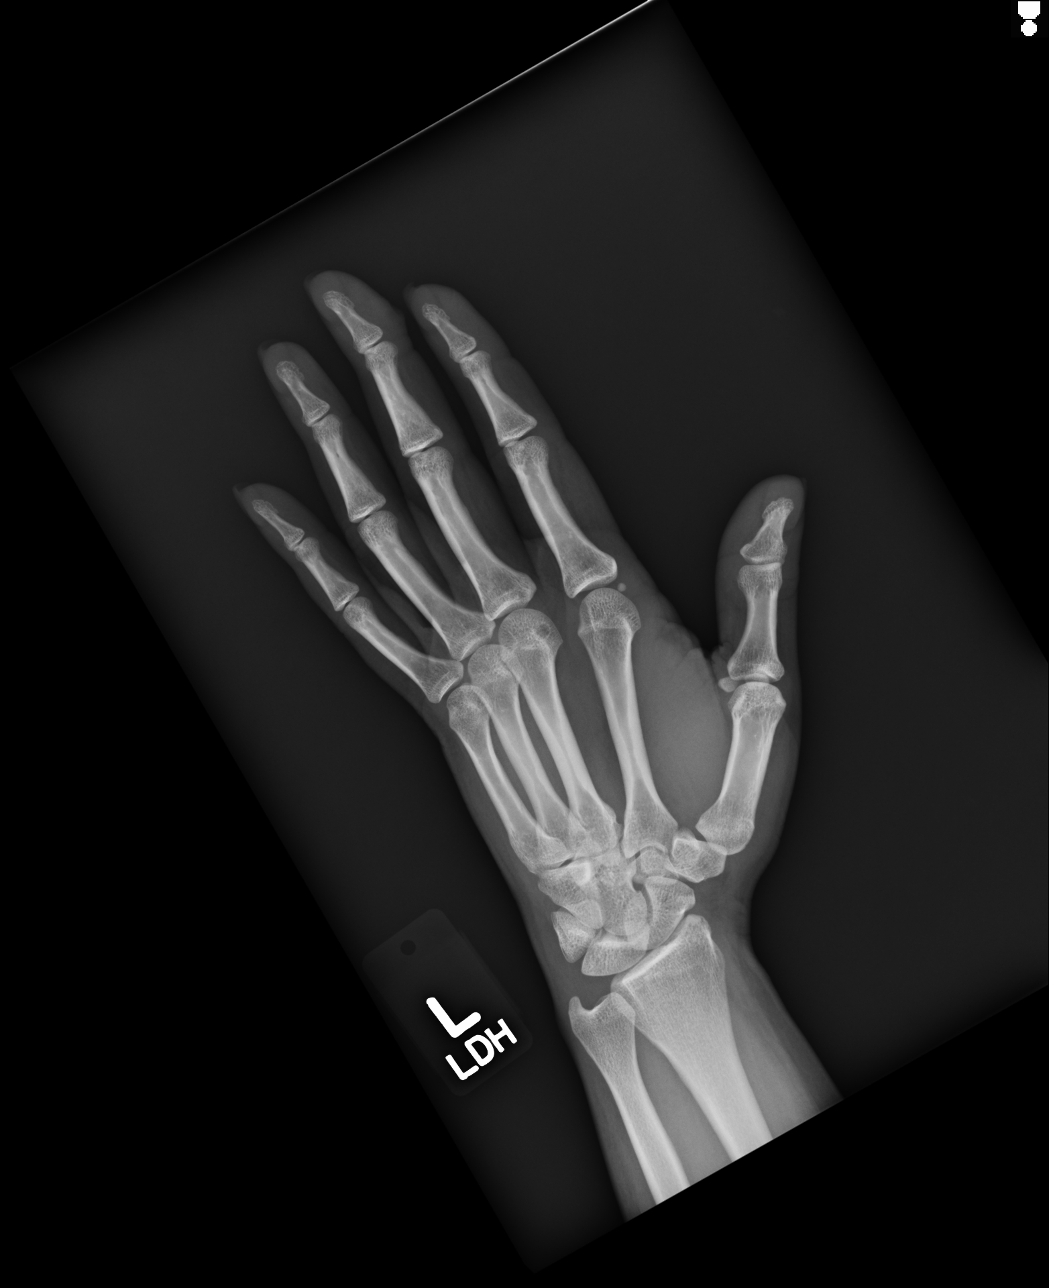
[im 2/3]
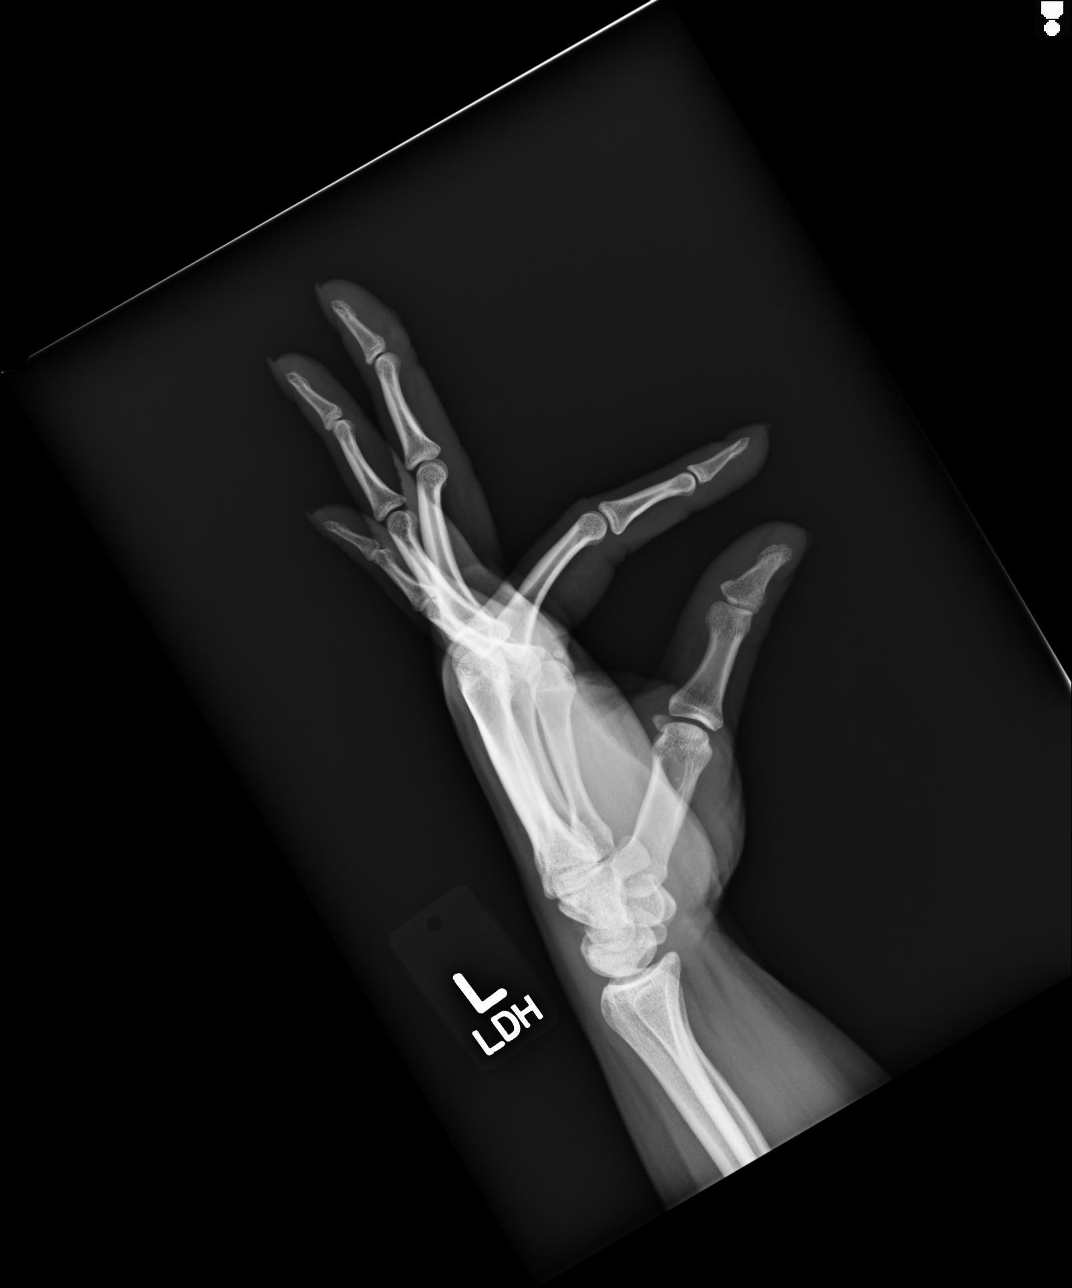
[im 3/3]
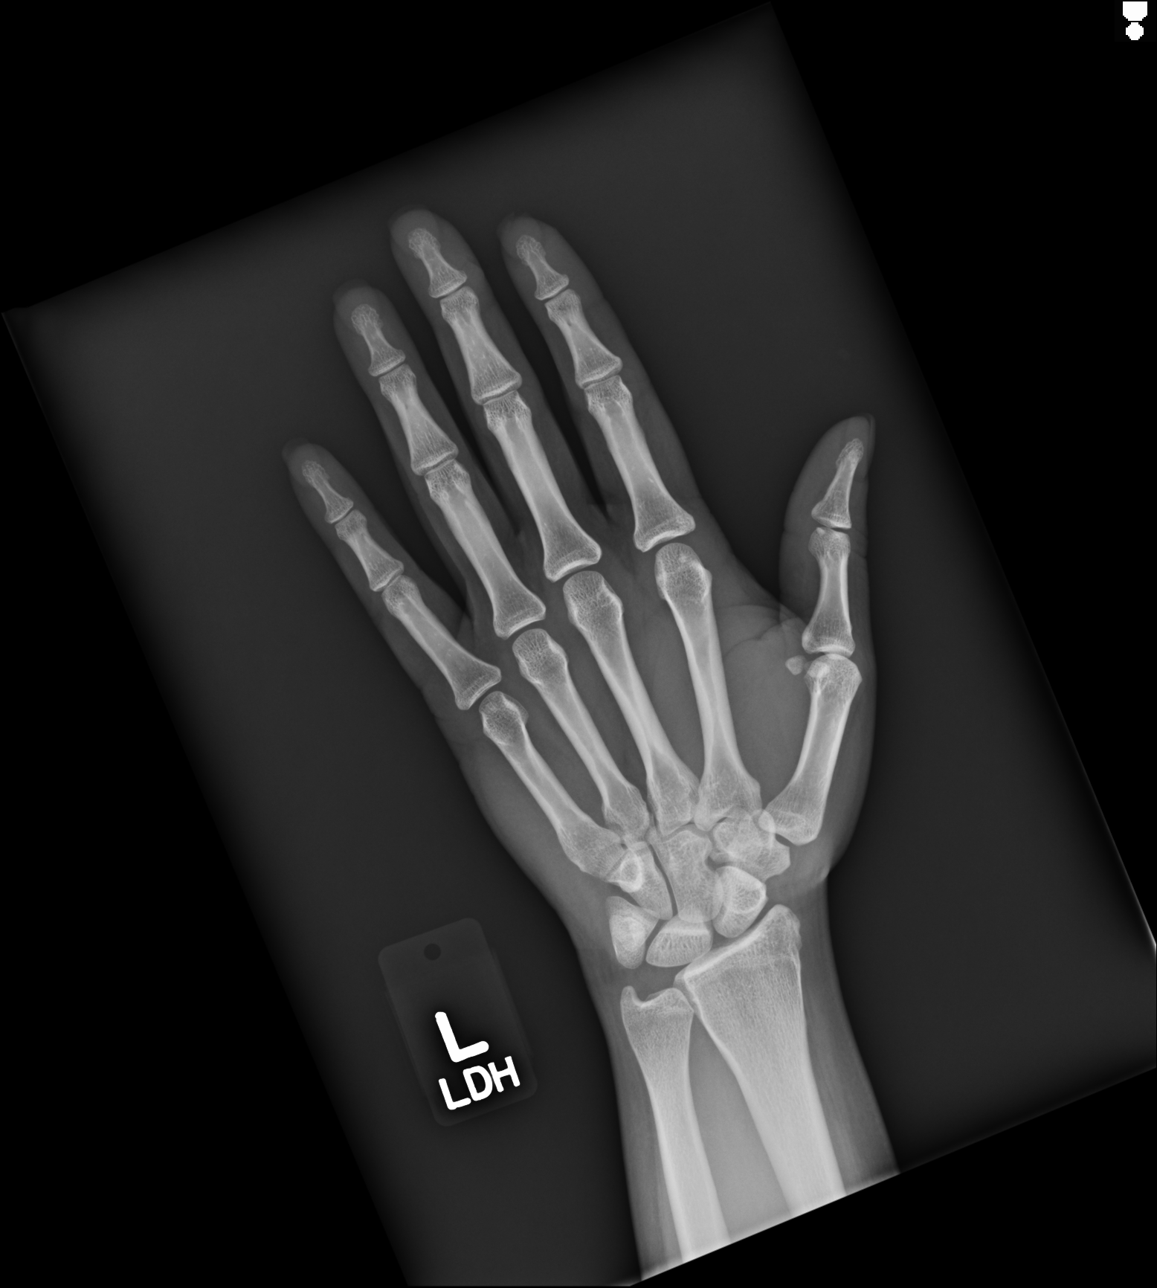

[3 of 3 positions shown; findings below may reference images not displayed]

FINDINGS: There is no evidence of fracture or dislocation. There is no
evidence of arthropathy or other focal bone abnormality. Soft
tissues are unremarkable.
IMPRESSION: Normal left hand.

## 2016-11-22 IMAGING — CT CT ABD-PELV W/ CM
1 of 2 series · 14 of 32 positions shown, 18 images · IV contrast (omnipaque)
Comparison: None.

CLINICAL DATA: Right lower quadrant pain for 2 days with nausea.

EXAM:
CT ABDOMEN AND PELVIS WITH CONTRAST
TECHNIQUE: Multidetector CT imaging of the abdomen and pelvis was performed
using the standard protocol following bolus administration of
intravenous contrast.
CONTRAST:  100mL OMNIPAQUE IOHEXOL 300 MG/ML SOLN, 50mL OMNIPAQUE
IOHEXOL 240 MG/ML SOLN

[Series 2: routine abd pel with · axial · 0.72mm/px · z∈[-753,-363]mm · 14 of 86 slices shown, 18 images]
[im 4/86  soft-tissue]
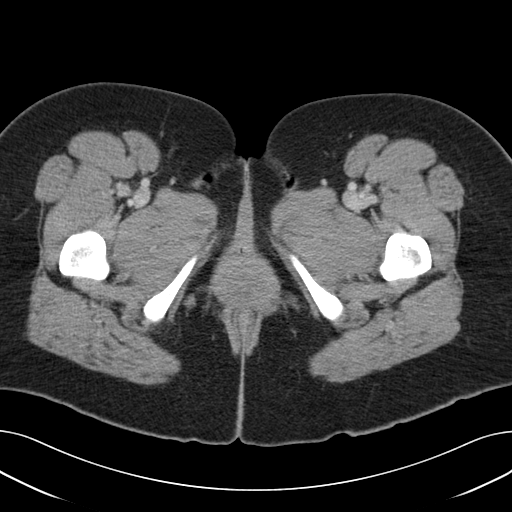
[im 4/86  bone]
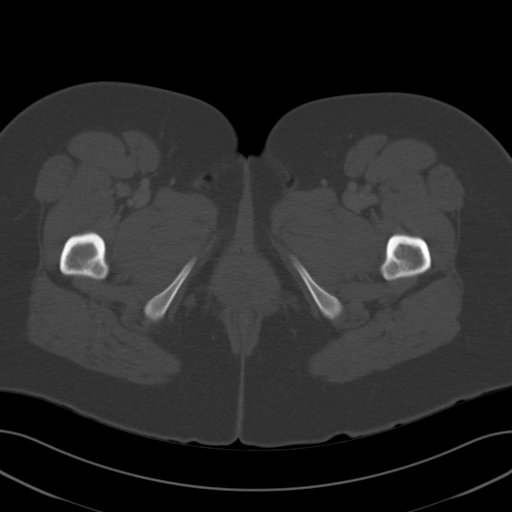
[im 11/86  soft-tissue]
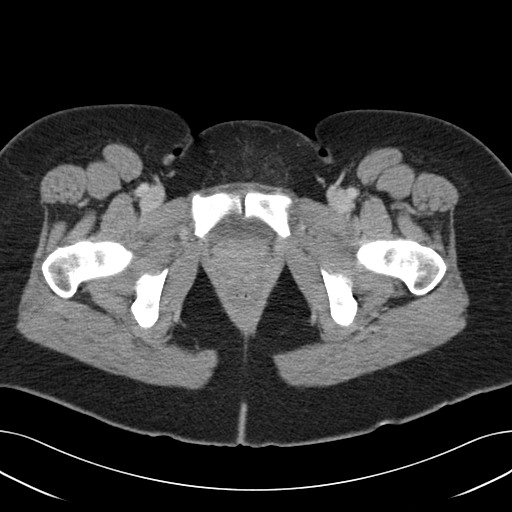
[im 18/86  soft-tissue]
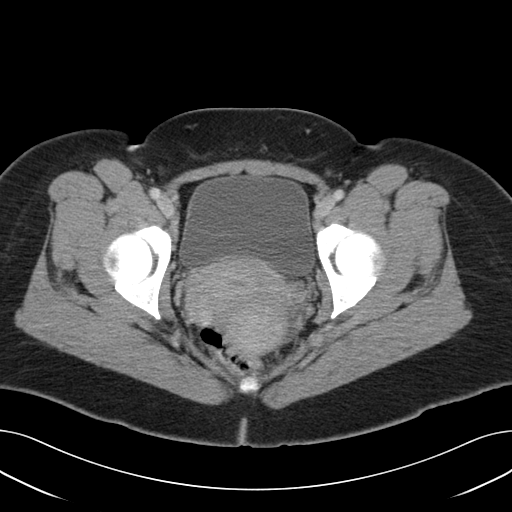
[im 25/86  soft-tissue]
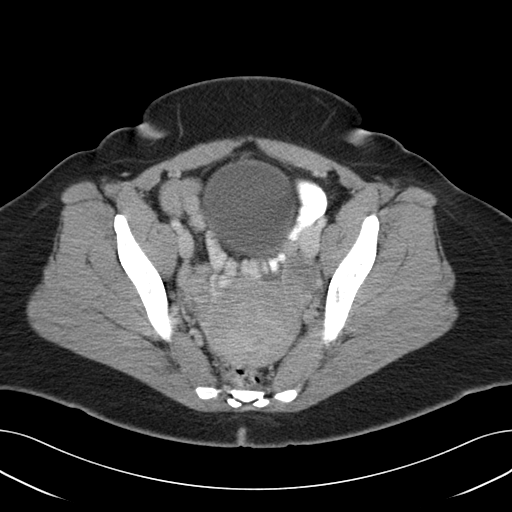
[im 32/86  soft-tissue]
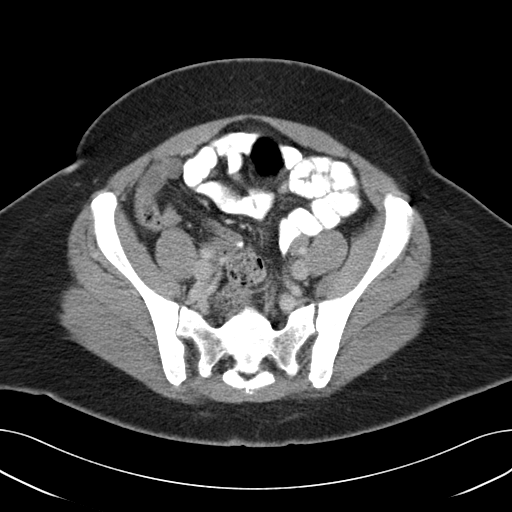
[im 39/86  soft-tissue]
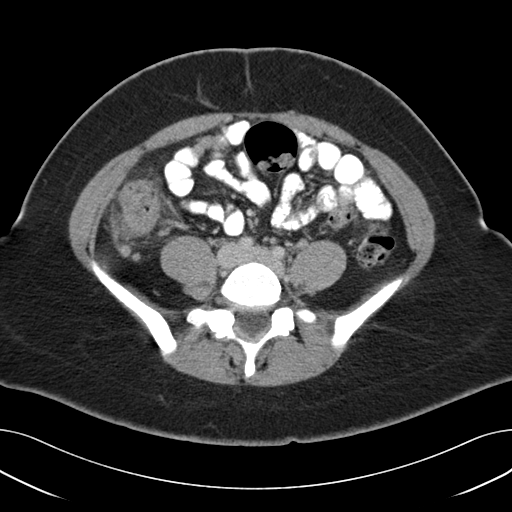
[im 47/86  soft-tissue]
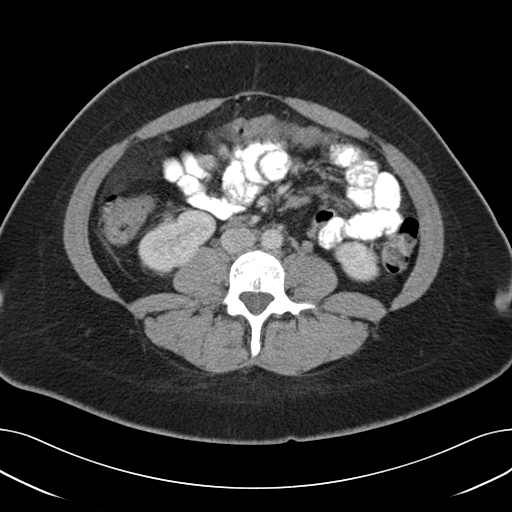
[im 54/86  soft-tissue]
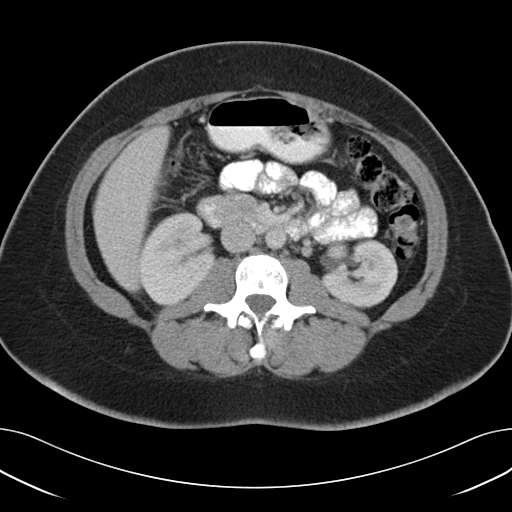
[im 61/86  soft-tissue]
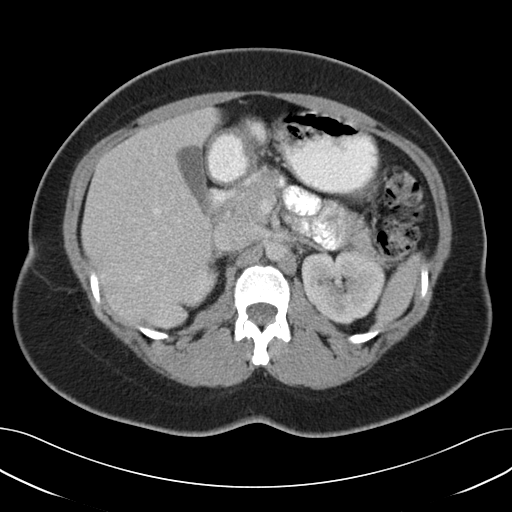
[im 61/86  bone]
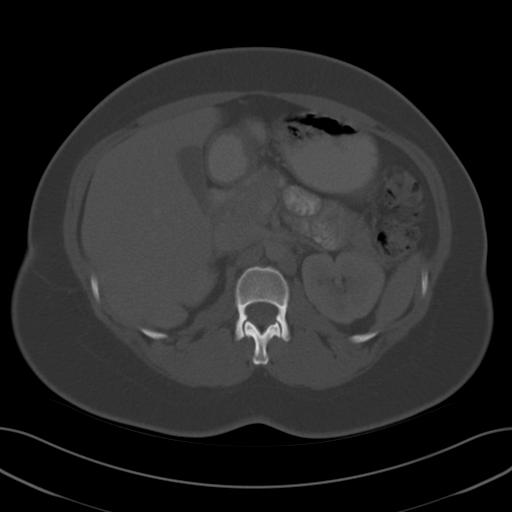
[im 68/86  soft-tissue]
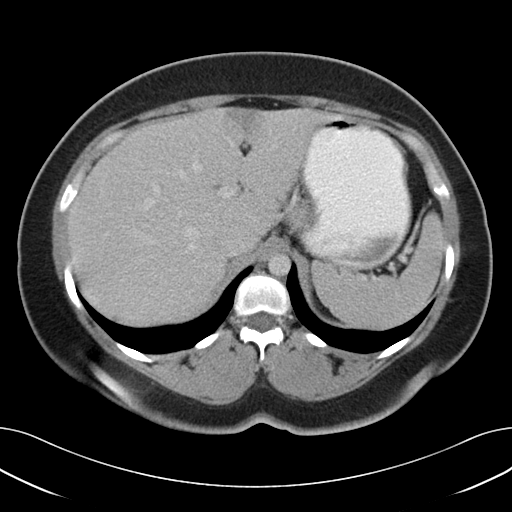
[im 71/86  lung]
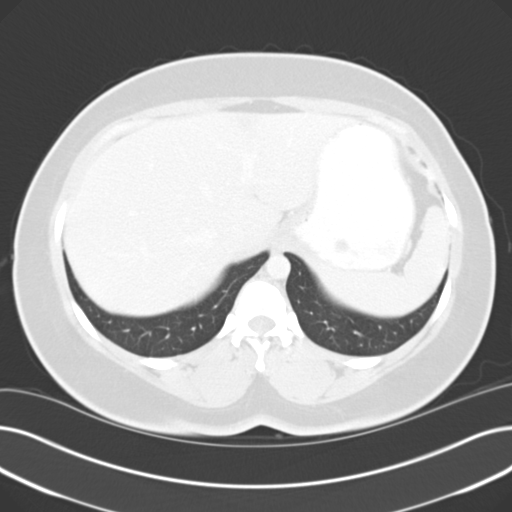
[im 75/86  soft-tissue]
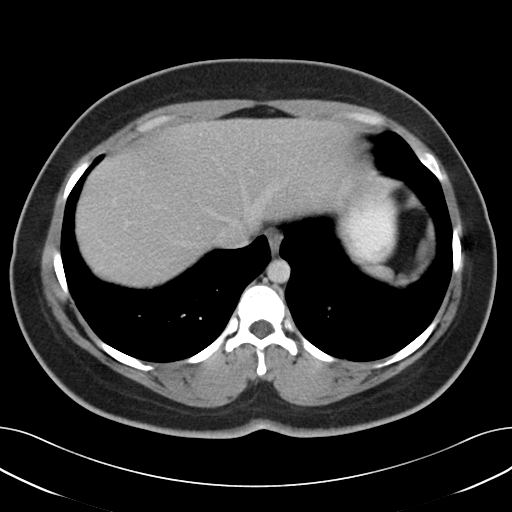
[im 75/86  lung]
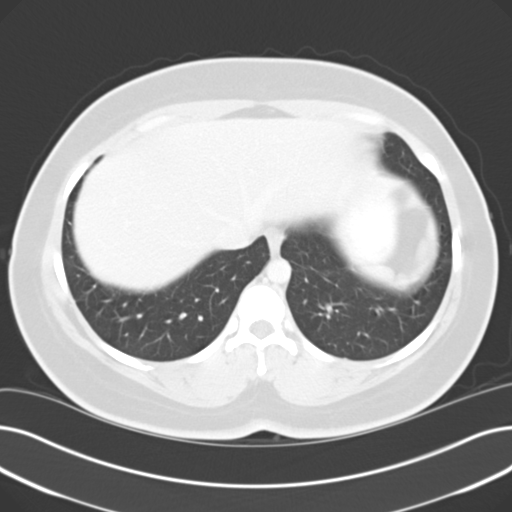
[im 78/86  lung]
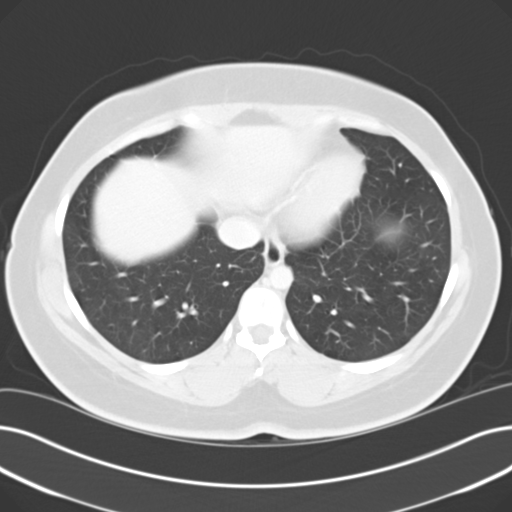
[im 82/86  soft-tissue]
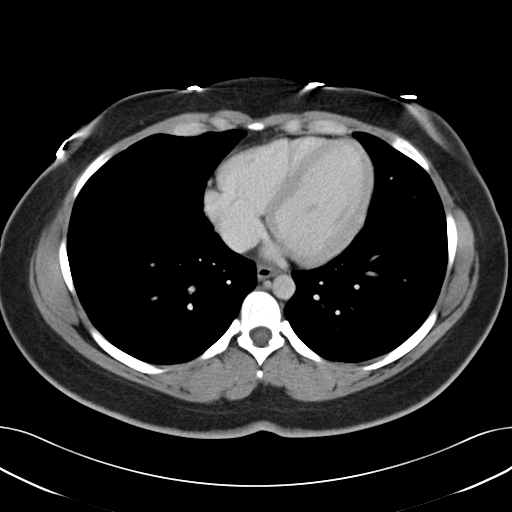
[im 82/86  lung]
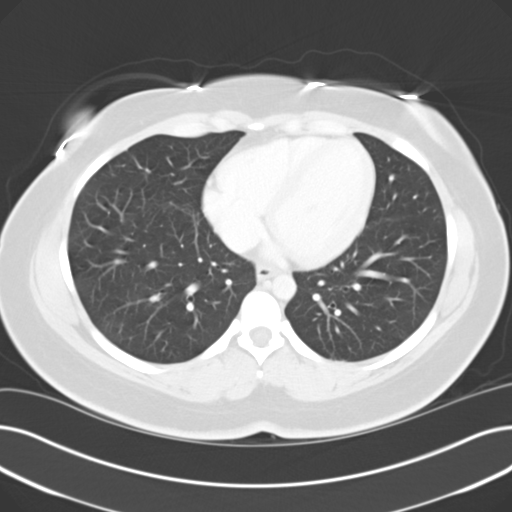

[14 of 32 positions shown; findings below may reference images not displayed]

FINDINGS: Lower chest: Clear lung bases. Normal heart size without pericardial
or pleural effusion.

Hepatobiliary: Focal steatosis adjacent the falciform ligament.
Normal gallbladder, without biliary ductal dilatation.

Pancreas: Normal, without mass or ductal dilatation.

Spleen: Normal.

Adrenals/Urinary Tract: Normal adrenal glands.Normal kidneys,
without hydronephrosis. Normal urinary bladder.

Stomach/Bowel: Normal stomach, without wall thickening. Colonic
stool burden suggests constipation. The appendix is upper normal to
mildly dilated, including at 7 mm on coronal image 76 of series 5.
There is inflammation in the right lower quadrant, epicenter favored
to be cephalad to the appendix, adjacent to the lateral wall of the
ascending colon. Example image 48 of series 2. The adjacent the
ascending colon is mildly thickened. Fat plane immediately adjacent
to the appendix appears relatively uninvolved (although inflammation
is seen adjacent to the more distal appendix on image 80 of series
5.) Normal terminal ileum. Mildly prominent ileocolic mesenteric
nodes are likely reactive. Normal small bowel. No right lower
quadrant fluid collection.

Vascular/Lymphatic: Normal caliber of the aorta and branch vessels.
No abdominopelvic adenopathy.

Reproductive: Retroverted uterus.  No adnexal mass.

Other: No significant free fluid.

Musculoskeletal: Degenerative sclerosis of the bilateral sacroiliac
joints. Disc bulge at the lumbosacral junction.
IMPRESSION: 1. Right lower quadrant inflammation, with epicenter favored to be
adjacent to the ascending colon (indicative of colitis). The
appendix is upper normal to minimally dilated, primarily felt to be
positioned inferior to the inflammation. As the distal most appendix
is subjacent to inflammation, early or tip appendicitis is a less
likely consideration. Recommend clinical surveillance and possibly
surgical consultation. These results will be called to the ordering
clinician or representative by the [HOSPITAL] at the
imaging location (patient is being held).
2.  Possible constipation.

## 2018-06-13 ENCOUNTER — Emergency Department (HOSPITAL_COMMUNITY)
Admission: EM | Admit: 2018-06-13 | Discharge: 2018-06-13 | Disposition: A | Discharge status: Home / Self Care | Payer: 59 | Attending: Emergency Medicine | Admitting: Emergency Medicine

## 2018-06-13 ENCOUNTER — Other Ambulatory Visit: Payer: Self-pay

## 2018-06-13 ENCOUNTER — Encounter (HOSPITAL_COMMUNITY): Payer: Self-pay

## 2018-06-13 DIAGNOSIS — S161XXA Strain of muscle, fascia and tendon at neck level, initial encounter: Secondary | ICD-10-CM | POA: Insufficient documentation

## 2018-06-13 DIAGNOSIS — Z79899 Other long term (current) drug therapy: Secondary | ICD-10-CM | POA: Diagnosis not present

## 2018-06-13 DIAGNOSIS — M7918 Myalgia, other site: Secondary | ICD-10-CM

## 2018-06-13 DIAGNOSIS — Y92410 Unspecified street and highway as the place of occurrence of the external cause: Secondary | ICD-10-CM | POA: Insufficient documentation

## 2018-06-13 DIAGNOSIS — Y939 Activity, unspecified: Secondary | ICD-10-CM | POA: Diagnosis not present

## 2018-06-13 DIAGNOSIS — S1980XA Other specified injuries of unspecified part of neck, initial encounter: Secondary | ICD-10-CM | POA: Diagnosis present

## 2018-06-13 DIAGNOSIS — Y999 Unspecified external cause status: Secondary | ICD-10-CM | POA: Diagnosis not present

## 2018-06-13 DIAGNOSIS — M791 Myalgia, unspecified site: Secondary | ICD-10-CM | POA: Insufficient documentation

## 2018-06-13 DIAGNOSIS — R569 Unspecified convulsions: Secondary | ICD-10-CM | POA: Diagnosis not present

## 2018-06-13 MED ORDER — CYCLOBENZAPRINE HCL 10 MG PO TABS
5.0000 mg | ORAL_TABLET | Freq: Once | ORAL | Status: AC
  Administered 2018-06-13: 5 mg via ORAL
  Filled 2018-06-13: qty 1

## 2018-06-13 MED ORDER — IBUPROFEN 600 MG PO TABS: 600.0000 mg | ORAL_TABLET | Freq: Four times a day (QID) | ORAL | 0 refills | Status: DC | PRN

## 2018-06-13 MED ORDER — IBUPROFEN 400 MG PO TABS
600.0000 mg | ORAL_TABLET | Freq: Once | ORAL | Status: AC
  Administered 2018-06-13: 600 mg via ORAL
  Filled 2018-06-13: qty 1

## 2018-06-13 MED ORDER — CYCLOBENZAPRINE HCL 10 MG PO TABS: 10.0000 mg | ORAL_TABLET | Freq: Two times a day (BID) | ORAL | 0 refills | Status: DC | PRN
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
## 2018-06-13 NOTE — Discharge Instructions (Addendum)
Home to rest, take Motrin and Flexeril as needed as prescribed.  Apply warm compresses to sore muscles for 20 minutes at a time.  Follow-up with your PCP.

## 2019-08-23 ENCOUNTER — Other Ambulatory Visit: Payer: Self-pay

## 2019-08-23 DIAGNOSIS — Z20822 Contact with and (suspected) exposure to covid-19: Secondary | ICD-10-CM

## 2019-08-24 LAB — NOVEL CORONAVIRUS, NAA: SARS-CoV-2, NAA: NOT DETECTED

## 2022-11-28 ENCOUNTER — Ambulatory Visit: Payer: 59 | Admitting: Nurse Practitioner

## 2022-11-28 ENCOUNTER — Encounter: Payer: Self-pay | Admitting: Nurse Practitioner

## 2022-11-28 VITALS — BP 130/90 | HR 86 | Ht 63.0 in | Wt 217.8 lb

## 2022-11-28 DIAGNOSIS — R030 Elevated blood-pressure reading, without diagnosis of hypertension: Secondary | ICD-10-CM

## 2022-11-28 DIAGNOSIS — R634 Abnormal weight loss: Secondary | ICD-10-CM | POA: Insufficient documentation

## 2022-11-28 DIAGNOSIS — E663 Overweight: Secondary | ICD-10-CM | POA: Diagnosis not present

## 2022-11-28 DIAGNOSIS — R7303 Prediabetes: Secondary | ICD-10-CM

## 2022-11-28 NOTE — Patient Instructions (Signed)
1) BP recheck 2) Fasting labs today 3) Nutritionist consult 4) Follow up appt in 1 year

## 2022-11-28 NOTE — Progress Notes (Signed)
Established Patient Office Visit  Subjective:  Patient ID: Linda Pearson, female    DOB: [birth year]  Age: 42 y.o. MRN: [id number]  No chief complaint on file.   Follow up from 1 year, headache today, on cycle.  Reconnecting with care.  Fasting for labs.  No concerns.  Wants to lose weight.      Past Medical History:  Diagnosis Date   Anemia    history of anemia- > one yr   History of chlamydia 11/2010   negative in 12/2010   Right ovarian cyst    45x39x39m dermoid cyst R   Seizures ([facility])    as infant- febrile    Social History   Socioeconomic History   Marital status: Single    Spouse name: Not on file   Number of children: Not on file   Years of education: Not on file   Highest education level: Not on file  Occupational History   Not on file  Tobacco Use   Smoking status: Never   Smokeless tobacco: Never  Substance and Sexual Activity   Alcohol use: No   Drug use: No   Sexual activity: Yes    Birth control/protection: Implant  Other Topics Concern   Not on file  Social History Narrative   Not on file   Social Determinants of Health   Financial Resource Strain: Not on file  Food Insecurity: Not on file  Transportation Needs: Not on file  Physical Activity: Not on file  Stress: Not on file  Social Connections: Not on file  Intimate Partner Violence: Not on file    Family History  Problem Relation Age of Onset   Hypertension Mother    Diabetes Father    Heart attack Father    Heart disease Father    Kidney disease Father    Seizures Father    Hypertension Father    Diabetes Maternal Grandmother    Heart attack Maternal Grandmother    Stroke Maternal Grandmother    Hypertension Maternal Grandmother    Diabetes Paternal Grandmother    Diabetes Paternal Grandfather    Sickle cell trait Cousin    Anesthesia problems Neg Hx    Hypotension Neg Hx    Malignant hyperthermia Neg Hx    Pseudochol deficiency Neg Hx     Allergies  Allergen Reactions    Latex Other (See Comments)    Reaction tingling feeling, burning   Percocet [Oxycodone-Acetaminophen] Itching    Review of Systems  Constitutional: Negative.   HENT: Negative.    Eyes: Negative.   Respiratory: Negative.    Cardiovascular: Negative.   Gastrointestinal: Negative.   Genitourinary: Negative.   Musculoskeletal: Negative.   Skin: Negative.   Neurological: Negative.   Endo/Heme/Allergies: Negative.   Psychiatric/Behavioral: Negative.         Objective:   BP (!) 130/90   Pulse 86   Ht 5' 3"$  (1.6 m)   Wt 217 lb 12.8 oz (98.8 kg)   SpO2 98%   BMI 38.58 kg/m   Vitals:   11/28/22 0912  BP: (!) 130/90  Pulse: 86  Height: 5' 3"$  (1.6 m)  Weight: 217 lb 12.8 oz (98.8 kg)  SpO2: 98%  BMI (Calculated): 38.59    Physical Exam Constitutional:      Appearance: Normal appearance.  HENT:     Head: Normocephalic.     Nose: Nose normal.     Mouth/Throat:     Mouth: Mucous membranes are moist.  Eyes:  Pupils: Pupils are equal, round, and reactive to light.  Cardiovascular:     Rate and Rhythm: Normal rate and regular rhythm.  Pulmonary:     Breath sounds: Normal breath sounds.  Abdominal:     General: Bowel sounds are normal.  Musculoskeletal:        General: Normal range of motion.     Cervical back: Neck supple.  Skin:    General: Skin is warm and dry.  Neurological:     Mental Status: She is alert and oriented to person, place, and time.  Psychiatric:        Mood and Affect: Mood normal.        Behavior: Behavior normal.      No results found for any visits on 11/28/22.  No results found for this or any previous visit (from the past 2160 hour(s)).    Assessment & Plan:   Problem List Items Addressed This Visit       Other   Weight loss - Primary   Prehypertension   Other Visit Diagnoses     Overweight       Relevant Orders   Amb ref to Medical Nutrition Therapy-MNT       @VISPATINSTR$ @  Return in about 1 year (around  11/29/2023) for follow up , fasting labs prior.   Total time spent: 20 minutes  Evern Bio, NP  11/28/2022

## 2022-11-29 LAB — LIPID PANEL
Chol/HDL Ratio: 3.6 ratio (ref 0.0–4.4)
Cholesterol, Total: 176 mg/dL (ref 100–199)
HDL: 49 mg/dL (ref >39)
LDL Chol Calc (NIH): 112 mg/dL — ABNORMAL HIGH (ref 0–99)
Triglycerides: 78 mg/dL (ref 0–149)
VLDL Cholesterol Cal: 15 mg/dL (ref 5–40)

## 2022-11-29 LAB — HEMOGLOBIN A1C
Est. average glucose Bld gHb Est-mCnc: 120 mg/dL
Hgb A1c MFr Bld: 5.8 % — ABNORMAL HIGH (ref 4.8–5.6)

## 2022-11-29 LAB — CMP14+EGFR
ALT: 14 IU/L (ref 0–32)
AST: 13 IU/L (ref 0–40)
Albumin/Globulin Ratio: 1.4 (ref 1.2–2.2)
Albumin: 3.9 g/dL (ref 3.9–4.9)
Alkaline Phosphatase: 47 IU/L (ref 44–121)
BUN/Creatinine Ratio: 9 (ref 9–23)
BUN: 8 mg/dL (ref 6–20)
Bilirubin Total: <0.2 mg/dL (ref 0.0–1.2)
CO2: 21 mmol/L (ref 20–29)
Calcium: 9.6 mg/dL (ref 8.7–10.2)
Chloride: 106 mmol/L (ref 96–106)
Creatinine, Ser: 0.91 mg/dL (ref 0.57–1.00)
Globulin, Total: 2.8 g/dL (ref 1.5–4.5)
Glucose: 94 mg/dL (ref 70–99)
Potassium: 4.3 mmol/L (ref 3.5–5.2)
Sodium: 142 mmol/L (ref 134–144)
Total Protein: 6.7 g/dL (ref 6.0–8.5)
eGFR: 82 mL/min/{1.73_m2} (ref >59)

## 2022-11-29 LAB — TSH: TSH: 1.06 u[IU]/mL (ref 0.450–4.500)

## 2022-11-30 NOTE — Progress Notes (Signed)
Patient informed via portal

## 2023-01-11 ENCOUNTER — Encounter: Payer: Self-pay | Admitting: Dietician

## 2023-01-11 ENCOUNTER — Encounter: Payer: No Typology Code available for payment source | Attending: Nurse Practitioner | Admitting: Dietician

## 2023-01-11 NOTE — Patient Instructions (Addendum)
Take measures to help with portion control such as eating from smaller plates/ bowls, plating low carb veggies first, drink water or hot tea prior to eating, take a "time out" for a few minutes of relaxing/ deep breathing prior to eating,  Plan to eat something every 3-5 hours during the day, allowing at least 2-3 hours between meals and snacks Consider having a snack or light meal within 1-2 hours after waking up.  Continue to walk regularly and include movement throughout the day as able. Stretch sessions during workday are great!

## 2023-01-11 NOTE — Progress Notes (Signed)
Medical Nutrition Therapy: Visit start time: [id number]  end time: 1415  Assessment:   Referral Diagnosis: overweight/ obesity Other medical history/ diagnoses: HTN, hyperlipidemia, GERD, chronic back pain Psychosocial issues/ stress concerns: reports high stress level; feels she should improve stress management  Medications, supplements: reconciled list in medical record   Preferred learning method:  Auditory Visual   Current weight: 216.6lbs Height: 5'4" BMI: 37.18 Patient's personal weight goal: 190lbs  Progress and evaluation:  Patient reports working to [employer], fruit, protein, and less carbs, less fried foods, processed foods. She lost about 15lbs, but has regained a few pounsts.  Drinking mostly water or sugar free drinks. Was using MyFitnessPal to track intake. Goal was 1377kcal, was often below goal. Food allergies: none Special diet practices: none Patient seeks help with weight management Next PCP appt is approximately 7 or 05/2023   Dietary Intake:  Usual eating pattern includes 2-3 meals and 1-2 snacks per day. Dining out frequency: 3 meals per week. Who plans meals/ buys groceries? self Who prepares meals? self  Breakfast: 10:30-11am scrambled egg, 2 strips bacon or [place] sausage, sometimes grits; fruit smoothie and bagel with butter or jelly, no cheese; 1c. [employer] cheerios/ honey with oats cheerios Snack: pretzels and cheese; goldfish crackers; few pieces candy ie nerd gummies Lunch: usually none due to late breakfast; sometimes  Snack: sometimes smoothie Supper: 6-8pm sometimes takeout  [employer]/ [employer]/ due to daughter's dance class in evening; healthy choice meal; with salad and fruit; pizza bites; leftovers ie chicken rice and veg; sandwich ie grilled cheese with fries or lightly salted chips Snack: occasionally ice cream esp if close to menstrual cycle Beverages: coffee 1/2 caff; water, occ with sugar free flavoring; sugar free Nature's Twist; sparkling Ice  (sugar free); hot tea with stevia  Physical activity: walking, weight lifting 30+ minutes 3x a week 1 mile with dog   Intervention:   Nutrition Care Education:   Basic nutrition: basic food groups; appropriate nutrient balance; appropriate meal and snack schedule; general nutrition guidelines    Weight control: importance of low sugar and low fat choices; portion control strategies; estimated energy needs for weight loss at 1200-1400 kcal, provided guidance for 45%CHO, 25% pro, 30% fat; role of physical activity and suitable options Other: managing stress prior to eating for portion control; benefits of even brief methods of stress management  Other intervention notes: Patient has been working on weight loss by dietary change and has noticed some progress, recently halted. She is ready to resume some changes and make new changes to resume weight loss. Established goals with direction from patient.     Nutritional Diagnosis:  [place]-3.3 Overweight/obesity As related to history of excess calories, inadequate physical activity, stress.  As evidenced by patient with current BMI of 37.   Education Materials given:  Museum/gallery conservator with food lists, sample meal pattern Sample menus Snacking handout Visit summary with goals/ instructions   Learner/ who was taught:  Patient   Level of understanding: Verbalizes/ demonstrates competency  Demonstrated degree of understanding via:   Teach back Learning barriers: None  Willingness to learn/ readiness for change: Eager, change in progress   Monitoring and Evaluation:  Dietary intake, exercise,  and body weight      follow up:  03/09/23 at 11:15am

## 2023-02-03 ENCOUNTER — Other Ambulatory Visit: Payer: Self-pay

## 2023-02-06 MED ORDER — METOPROLOL SUCCINATE ER 25 MG PO TB24: 25.0000 mg | ORAL_TABLET | Freq: Every day | ORAL | 0 refills | Status: DC

## 2023-03-09 ENCOUNTER — Ambulatory Visit: Payer: No Typology Code available for payment source | Admitting: Dietician

## 2023-03-23 ENCOUNTER — Encounter: Payer: Self-pay | Admitting: Dietician

## 2023-03-23 NOTE — Progress Notes (Signed)
Patient cancelled her MNT follow up appointment for 03/09/23 and did not wish to reschedule. Sent notification to referring provider.

## 2023-05-25 ENCOUNTER — Telehealth: Payer: Self-pay

## 2023-05-25 NOTE — Telephone Encounter (Signed)
We received a clearance form for patient to have surg, she needs to make an appt for surgery clearance, clearance was placed in ambers box

## 2023-06-01 ENCOUNTER — Encounter: Payer: Self-pay | Admitting: Cardiology

## 2023-06-01 ENCOUNTER — Ambulatory Visit: Payer: 59 | Admitting: Cardiology

## 2023-06-01 VITALS — BP 126/82 | HR 75 | Ht 64.0 in | Wt 214.4 lb

## 2023-06-01 DIAGNOSIS — R7303 Prediabetes: Secondary | ICD-10-CM

## 2023-06-01 DIAGNOSIS — R030 Elevated blood-pressure reading, without diagnosis of hypertension: Secondary | ICD-10-CM | POA: Diagnosis not present

## 2023-06-01 NOTE — Progress Notes (Signed)
Established Patient Office Visit  Subjective:  Patient ID: Linda Pearson, female    DOB: [birth year]  Age: 42 y.o. MRN: [id number]  Chief Complaint  Patient presents with   Follow-up    Surgical clearance    Patient in office for surgical clearance for liposuction and tummy tuck in [place] at [facility]. Surgery is under conscious sedation at an outpatient center. Patient feeling well. No complaints.  Up to date on her health maintenance exams. Will check blood work today.    No other concerns at this time.   Past Medical History:  Diagnosis Date   Anemia    history of anemia- > one yr   History of chlamydia 11/2010   negative in 12/2010   Right ovarian cyst    45x39x26mm dermoid cyst R   Seizures (HCC)    as infant- febrile    Past Surgical History:  Procedure Laterality Date   ganglionic cyst  2007   done twice it grew back   LAPAROSCOPY  02/10/2012   Procedure: LAPAROSCOPY OPERATIVE;  Surgeon: Lavina Hamman, MD;  Location: [facility];  Service: Gynecology;  Laterality: N/A;   SALPINGOOPHORECTOMY  02/10/2012   Procedure: SALPINGO OOPHERECTOMY;  Surgeon: Lavina Hamman, MD;  Location: [facility];  Service: Gynecology;  Laterality: Right;    Social History   Socioeconomic History   Marital status: Single    Spouse name: Not on file   Number of children: Not on file   Years of education: Not on file   Highest education level: Not on file  Occupational History   Not on file  Tobacco Use   Smoking status: Never   Smokeless tobacco: Never  Substance and Sexual Activity   Alcohol use: No    Comment: rarely   Drug use: No   Sexual activity: Yes    Birth control/protection: Implant  Other Topics Concern   Not on file  Social History Narrative   Not on file   Social Determinants of Health   Financial Resource Strain: Not on file  Food Insecurity: Not on file  Transportation Needs: Not on file  Physical Activity: Not on file  Stress: Not on file  Social Connections:  Unknown (02/26/2022)   Received from [facility], [facility]   Social Network    Social Network: Not on file  Intimate Partner Violence: Unknown (01/19/2022)   Received from [employer], [facility]   HITS    Physically Hurt: Not on file    Insult or Talk Down To: Not on file    Threaten Physical Harm: Not on file    Scream or Curse: Not on file    Family History  Problem Relation Age of Onset   Hypertension Mother    Diabetes Father    Heart attack Father    Heart disease Father    Kidney disease Father    Seizures Father    Hypertension Father    Diabetes Maternal Grandmother    Heart attack Maternal Grandmother    Stroke Maternal Grandmother    Hypertension Maternal Grandmother    Diabetes Paternal Grandmother    Diabetes Paternal Grandfather    Sickle cell trait Cousin    Anesthesia problems Neg Hx    Hypotension Neg Hx    Malignant hyperthermia Neg Hx    Pseudochol deficiency Neg Hx     Allergies  Allergen Reactions   Latex Other (See Comments)    Reaction tingling feeling, burning   Percocet [Oxycodone-Acetaminophen] Itching  Review of Systems  Constitutional: Negative.   HENT: Negative.    Eyes: Negative.   Respiratory: Negative.  Negative for shortness of breath.   Cardiovascular: Negative.  Negative for chest pain.  Gastrointestinal: Negative.  Negative for abdominal pain, constipation and diarrhea.  Genitourinary: Negative.   Musculoskeletal:  Negative for joint pain and myalgias.  Skin: Negative.   Neurological: Negative.  Negative for dizziness and headaches.  Endo/Heme/Allergies: Negative.   All other systems reviewed and are negative.      Objective:   BP 126/82 (BP Location: Left Arm, Patient Position: Sitting, Cuff Size: Large)   Pulse 75   Ht 5\' 4"  (1.626 m)   Wt 214 lb 6.4 oz (97.3 kg)   SpO2 100%   BMI 36.80 kg/m   Vitals:   06/01/23 1122 06/01/23 1153  BP: (!) 150/90 126/82  Pulse: 75   Height: 5\' 4"  (1.626 m)    Weight: 214 lb 6.4 oz (97.3 kg)   SpO2: 100%   BMI (Calculated): 36.78     Physical Exam Vitals and nursing note reviewed.  Constitutional:      Appearance: Normal appearance. She is normal weight.  HENT:     Head: Normocephalic and atraumatic.     Nose: Nose normal.     Mouth/Throat:     Mouth: Mucous membranes are moist.  Eyes:     Extraocular Movements: Extraocular movements intact.     Conjunctiva/sclera: Conjunctivae normal.     Pupils: Pupils are equal, round, and reactive to light.  Cardiovascular:     Rate and Rhythm: Normal rate and regular rhythm.     Pulses: Normal pulses.     Heart sounds: Normal heart sounds.  Pulmonary:     Effort: Pulmonary effort is normal.     Breath sounds: Normal breath sounds.  Abdominal:     General: Abdomen is flat. Bowel sounds are normal.     Palpations: Abdomen is soft.  Musculoskeletal:        General: Normal range of motion.     Cervical back: Normal range of motion.  Skin:    General: Skin is warm and dry.  Neurological:     General: No focal deficit present.     Mental Status: She is alert and oriented to person, place, and time.  Psychiatric:        Mood and Affect: Mood normal.        Behavior: Behavior normal.        Thought Content: Thought content normal.        Judgment: Judgment normal.      No results found for any visits on 06/01/23.  No results found for this or any previous visit (from the past 2160 hour(s)).    Assessment & Plan:  Pending blood work results, patient is cleared for surgery.  Problem List Items Addressed This Visit       Other   Prehypertension - Primary   Relevant Orders   Hemoglobin A1c   CBC with Differential/Platelet   CMP14+EGFR   Lipid Profile   TSH   Other Visit Diagnoses     Prediabetes       Relevant Orders   Hemoglobin A1c   CBC with Differential/Platelet   CMP14+EGFR   Lipid Profile   TSH       Return in about 6 months (around 12/02/2023).   Total time  spent: 25 minutes  [employer], NP  06/01/2023   This document may have been prepared  by [employer] and as such may include unintentional dictation errors.

## 2023-06-02 LAB — CMP14+EGFR
ALT: 11 IU/L (ref 0–32)
AST: 11 IU/L (ref 0–40)
Albumin: 4.5 g/dL (ref 3.9–4.9)
Alkaline Phosphatase: 55 IU/L (ref 44–121)
BUN/Creatinine Ratio: 8 — ABNORMAL LOW (ref 9–23)
BUN: 7 mg/dL (ref 6–24)
Bilirubin Total: 0.3 mg/dL (ref 0.0–1.2)
CO2: 24 mmol/L (ref 20–29)
Calcium: 9.9 mg/dL (ref 8.7–10.2)
Chloride: 103 mmol/L (ref 96–106)
Creatinine, Ser: 0.92 mg/dL (ref 0.57–1.00)
Globulin, Total: 2.6 g/dL (ref 1.5–4.5)
Glucose: 90 mg/dL (ref 70–99)
Potassium: 4.2 mmol/L (ref 3.5–5.2)
Sodium: 140 mmol/L (ref 134–144)
Total Protein: 7.1 g/dL (ref 6.0–8.5)
eGFR: 81 mL/min/{1.73_m2} (ref >59)

## 2023-06-02 LAB — CBC WITH DIFFERENTIAL/PLATELET
Basophils Absolute: 0 10*3/uL (ref 0.0–0.2)
Basos: 0 %
EOS (ABSOLUTE): 0 10*3/uL (ref 0.0–0.4)
Eos: 0 %
Hematocrit: 36.7 % (ref 34.0–46.6)
Hemoglobin: 12.4 g/dL (ref 11.1–15.9)
Immature Grans (Abs): 0 10*3/uL (ref 0.0–0.1)
Immature Granulocytes: 0 %
Lymphocytes Absolute: 2.8 10*3/uL (ref 0.7–3.1)
Lymphs: 55 %
MCH: 30.6 pg (ref 26.6–33.0)
MCHC: 33.8 g/dL (ref 31.5–35.7)
MCV: 91 fL (ref 79–97)
Monocytes Absolute: 0.3 10*3/uL (ref 0.1–0.9)
Monocytes: 6 %
Neutrophils Absolute: 2 10*3/uL (ref 1.4–7.0)
Neutrophils: 39 %
Platelets: 312 10*3/uL (ref 150–450)
RBC: 4.05 x10E6/uL (ref 3.77–5.28)
RDW: 14.3 % (ref 11.7–15.4)
WBC: 5.2 10*3/uL (ref 3.4–10.8)

## 2023-06-02 LAB — LIPID PANEL
Chol/HDL Ratio: 4 ratio (ref 0.0–4.4)
Cholesterol, Total: 216 mg/dL — ABNORMAL HIGH (ref 100–199)
HDL: 54 mg/dL (ref >39)
LDL Chol Calc (NIH): 148 mg/dL — ABNORMAL HIGH (ref 0–99)
Triglycerides: 81 mg/dL (ref 0–149)
VLDL Cholesterol Cal: 14 mg/dL (ref 5–40)

## 2023-06-02 LAB — TSH: TSH: 1.09 u[IU]/mL (ref 0.450–4.500)

## 2023-06-02 LAB — HEMOGLOBIN A1C
Est. average glucose Bld gHb Est-mCnc: 126 mg/dL
Hgb A1c MFr Bld: 6 % — ABNORMAL HIGH (ref 4.8–5.6)

## 2023-06-05 NOTE — Progress Notes (Signed)
 Spoke who verbalized understanding.

## 2023-06-06 ENCOUNTER — Other Ambulatory Visit: Payer: Self-pay | Admitting: Family

## 2023-06-06 MED ORDER — OMEPRAZOLE 20 MG PO CPDR: 20.0000 mg | DELAYED_RELEASE_CAPSULE | Freq: Every day | ORAL | 1 refills | Status: DC

## 2023-06-06 MED ORDER — METOPROLOL SUCCINATE ER 25 MG PO TB24: 25.0000 mg | ORAL_TABLET | Freq: Every day | ORAL | 1 refills | Status: DC

## 2023-06-06 MED ORDER — HYDROCHLOROTHIAZIDE 12.5 MG PO CAPS: 12.5000 mg | ORAL_CAPSULE | Freq: Every day | ORAL | 1 refills | Status: DC

## 2023-06-08 ENCOUNTER — Other Ambulatory Visit: Payer: Self-pay

## 2023-06-08 NOTE — Telephone Encounter (Signed)
Updated pt pharmacy  

## 2023-07-12 ENCOUNTER — Other Ambulatory Visit: Payer: Self-pay

## 2023-07-12 MED ORDER — HYDROCHLOROTHIAZIDE 12.5 MG PO CAPS: 12.5000 mg | ORAL_CAPSULE | Freq: Every day | ORAL | 1 refills | Status: DC

## 2023-07-12 MED ORDER — METOPROLOL SUCCINATE ER 25 MG PO TB24: 25.0000 mg | ORAL_TABLET | Freq: Every day | ORAL | 1 refills | Status: DC

## 2023-07-12 MED ORDER — FERROUS SULFATE 324 (65 FE) MG PO TBEC: 324.0000 mg | DELAYED_RELEASE_TABLET | Freq: Every day | ORAL | 2 refills | Status: AC

## 2023-08-08 ENCOUNTER — Other Ambulatory Visit: Payer: Self-pay

## 2023-08-08 MED ORDER — OMEPRAZOLE 20 MG PO CPDR: 20.0000 mg | DELAYED_RELEASE_CAPSULE | Freq: Every day | ORAL | 1 refills | Status: DC

## 2023-11-29 ENCOUNTER — Ambulatory Visit: Payer: 59 | Admitting: Cardiology

## 2023-11-29 ENCOUNTER — Telehealth: Payer: Self-pay | Admitting: Family

## 2023-11-29 NOTE — Telephone Encounter (Signed)
Patient left VM wanting to know if she needed her labs drawn before her upcoming appointment.  She is due for labs so I spoke with her and advised her to come for fasting labs this week.

## 2023-11-30 ENCOUNTER — Other Ambulatory Visit: Payer: 59

## 2023-11-30 DIAGNOSIS — E663 Overweight: Secondary | ICD-10-CM

## 2023-11-30 DIAGNOSIS — R030 Elevated blood-pressure reading, without diagnosis of hypertension: Secondary | ICD-10-CM

## 2023-11-30 DIAGNOSIS — R7303 Prediabetes: Secondary | ICD-10-CM

## 2023-12-01 LAB — CMP14+EGFR
ALT: 13 [IU]/L (ref 0–32)
AST: 13 [IU]/L (ref 0–40)
Albumin: 4.2 g/dL (ref 3.9–4.9)
Alkaline Phosphatase: 60 [IU]/L (ref 44–121)
BUN/Creatinine Ratio: 11 (ref 9–23)
BUN: 10 mg/dL (ref 6–24)
Bilirubin Total: 0.2 mg/dL (ref 0.0–1.2)
CO2: 23 mmol/L (ref 20–29)
Calcium: 9.6 mg/dL (ref 8.7–10.2)
Chloride: 102 mmol/L (ref 96–106)
Creatinine, Ser: 0.93 mg/dL (ref 0.57–1.00)
Globulin, Total: 2.6 g/dL (ref 1.5–4.5)
Glucose: 106 mg/dL — ABNORMAL HIGH (ref 70–99)
Potassium: 4.1 mmol/L (ref 3.5–5.2)
Sodium: 140 mmol/L (ref 134–144)
Total Protein: 6.8 g/dL (ref 6.0–8.5)
eGFR: 80 mL/min/{1.73_m2} (ref >59)

## 2023-12-01 LAB — HEMOGLOBIN A1C
Est. average glucose Bld gHb Est-mCnc: 137 mg/dL
Hgb A1c MFr Bld: 6.4 % — ABNORMAL HIGH (ref 4.8–5.6)

## 2023-12-01 LAB — TSH: TSH: 1.12 u[IU]/mL (ref 0.450–4.500)

## 2023-12-01 LAB — LIPID PANEL
Chol/HDL Ratio: 3.8 {ratio} (ref 0.0–4.4)
Cholesterol, Total: 175 mg/dL (ref 100–199)
HDL: 46 mg/dL (ref >39)
LDL Chol Calc (NIH): 111 mg/dL — ABNORMAL HIGH (ref 0–99)
Triglycerides: 97 mg/dL (ref 0–149)
VLDL Cholesterol Cal: 18 mg/dL (ref 5–40)

## 2023-12-03 ENCOUNTER — Other Ambulatory Visit: Payer: Self-pay | Admitting: Family

## 2023-12-04 ENCOUNTER — Ambulatory Visit: Payer: 59 | Admitting: Family

## 2023-12-04 ENCOUNTER — Encounter: Payer: Self-pay | Admitting: Family

## 2023-12-04 DIAGNOSIS — R7303 Prediabetes: Secondary | ICD-10-CM | POA: Insufficient documentation

## 2023-12-04 DIAGNOSIS — Z0130 Encounter for examination of blood pressure without abnormal findings: Secondary | ICD-10-CM

## 2023-12-04 DIAGNOSIS — L659 Nonscarring hair loss, unspecified: Secondary | ICD-10-CM | POA: Diagnosis not present

## 2023-12-04 DIAGNOSIS — L723 Sebaceous cyst: Secondary | ICD-10-CM

## 2023-12-04 NOTE — Progress Notes (Signed)
 Established Patient Office Visit  Subjective:  Patient ID: Linda Pearson, female    DOB: [birth year]  Age: 43 y.o. MRN: [id number]  Chief Complaint  Patient presents with   Follow-up    6 month follow up    Patient is here today for her 3 months follow up.  She has been feeling fairly well since last appointment.   She does have additional concerns to discuss today.  Her biggest concern today is her weight.  She asks if there is anything we can do to help her with this, as she has been trying with lifestyle changes, but has not been successful.  She also has a cyst that has popped up in her left axilla, says that she has noticed a few of these over the past few months.  They are deeper, do not come to the surface, has not had any that have had heads on them or drained.   Labs are due today. She needs refills.   I have reviewed her active problem list, medication list, allergies, health maintenance, notes from last encounter, lab results for her appointment today.      No other concerns at this time.   Past Medical History:  Diagnosis Date   Anemia    history of anemia- > one yr   History of chlamydia 11/2010   negative in 12/2010   Right ovarian cyst    45x39x15mm dermoid cyst R   Seizures (HCC)    as infant- febrile    Past Surgical History:  Procedure Laterality Date   ganglionic cyst  2007   done twice it grew back   LAPAROSCOPY  02/10/2012   Procedure: LAPAROSCOPY OPERATIVE;  Surgeon: Lavina Hamman, MD;  Location: [facility];  Service: Gynecology;  Laterality: N/A;   SALPINGOOPHORECTOMY  02/10/2012   Procedure: SALPINGO OOPHERECTOMY;  Surgeon: Lavina Hamman, MD;  Location: [facility];  Service: Gynecology;  Laterality: Right;    Social History   Socioeconomic History   Marital status: Single    Spouse name: Not on file   Number of children: Not on file   Years of education: Not on file   Highest education level: Not on file  Occupational History   Not on file   Tobacco Use   Smoking status: Never   Smokeless tobacco: Never  Substance and Sexual Activity   Alcohol use: No    Comment: rarely   Drug use: No   Sexual activity: Yes    Birth control/protection: Implant  Other Topics Concern   Not on file  Social History Narrative   Not on file   Social Drivers of Health   Financial Resource Strain: Not on file  Food Insecurity: Not on file  Transportation Needs: Not on file  Physical Activity: Not on file  Stress: Not on file  Social Connections: Unknown (02/26/2022)   Received from [facility], [facility]   Social Network    Social Network: Not on file  Intimate Partner Violence: Unknown (01/19/2022)   Received from [facility], [facility]   HITS    Physically Hurt: Not on file    Insult or Talk Down To: Not on file    Threaten Physical Harm: Not on file    Scream or Curse: Not on file    Family History  Problem Relation Age of Onset   Hypertension Mother    Diabetes Father    Heart attack Father    Heart disease Father    Kidney disease  Father    Seizures Father    Hypertension Father    Diabetes Maternal Grandmother    Heart attack Maternal Grandmother    Stroke Maternal Grandmother    Hypertension Maternal Grandmother    Diabetes Paternal Grandmother    Diabetes Paternal Grandfather    Sickle cell trait Cousin    Anesthesia problems Neg Hx    Hypotension Neg Hx    Malignant hyperthermia Neg Hx    Pseudochol deficiency Neg Hx     Allergies  Allergen Reactions   Latex Other (See Comments)    Reaction tingling feeling, burning   Percocet [Oxycodone-Acetaminophen] Itching    Review of Systems  All other systems reviewed and are negative.      Objective:   BP 126/82   Pulse 73   Ht 5\' 4"  (1.626 m)   Wt 216 lb 6.4 oz (98.2 kg)   SpO2 98%   BMI 37.14 kg/m   Vitals:   12/04/23 0957  BP: 126/82  Pulse: 73  Height: 5\' 4"  (1.626 m)  Weight: 216 lb 6.4 oz (98.2 kg)  SpO2: 98%  BMI  (Calculated): 37.13    Physical Exam Vitals and nursing note reviewed.  Constitutional:      General: She is awake.     Appearance: Normal appearance. She is well-developed and well-groomed. She is obese.  HENT:     Head: Normocephalic.  Eyes:     Extraocular Movements: Extraocular movements intact.     Conjunctiva/sclera: Conjunctivae normal.     Pupils: Pupils are equal, round, and reactive to light.  Cardiovascular:     Rate and Rhythm: Normal rate.  Pulmonary:     Effort: Pulmonary effort is normal.  Skin:      Neurological:     General: No focal deficit present.     Mental Status: She is alert and oriented to person, place, and time. Mental status is at baseline.  Psychiatric:        Attention and Perception: Attention and perception normal.        Mood and Affect: Mood normal.        Behavior: Behavior normal. Behavior is cooperative.        Thought Content: Thought content normal.        Judgment: Judgment normal.      No results found for any visits on 12/04/23.  Recent Results (from the past 2160 hours)  Hemoglobin A1c     Status: Abnormal   Collection Time: 11/30/23  8:21 AM  Result Value Ref Range   Hgb A1c MFr Bld 6.4 (H) 4.8 - 5.6 %    Comment:          Prediabetes: 5.7 - 6.4          Diabetes: >6.4          Glycemic control for adults with diabetes: <7.0    Est. average glucose Bld gHb Est-mCnc 137 mg/dL  TSH     Status: None   Collection Time: 11/30/23  8:21 AM  Result Value Ref Range   TSH 1.120 0.450 - 4.500 uIU/mL  CMP14+EGFR     Status: Abnormal   Collection Time: 11/30/23  8:21 AM  Result Value Ref Range   Glucose 106 (H) 70 - 99 mg/dL   BUN 10 6 - 24 mg/dL   Creatinine, Ser 9.56 0.57 - 1.00 mg/dL   eGFR 80 >21 HY/QMV/7.84   BUN/Creatinine Ratio 11 9 - 23   Sodium 140 134 -  144 mmol/L   Potassium 4.1 3.5 - 5.2 mmol/L   Chloride 102 96 - 106 mmol/L   CO2 23 20 - 29 mmol/L   Calcium 9.6 8.7 - 10.2 mg/dL   Total Protein 6.8 6.0 - 8.5  g/dL   Albumin 4.2 3.9 - 4.9 g/dL   Globulin, Total 2.6 1.5 - 4.5 g/dL   Bilirubin Total 0.2 0.0 - 1.2 mg/dL   Alkaline Phosphatase 60 44 - 121 IU/L   AST 13 0 - 40 IU/L   ALT 13 0 - 32 IU/L  Lipid panel     Status: Abnormal   Collection Time: 11/30/23  8:21 AM  Result Value Ref Range   Cholesterol, Total 175 100 - 199 mg/dL   Triglycerides 97 0 - 149 mg/dL   HDL 46 >16 mg/dL   VLDL Cholesterol Cal 18 5 - 40 mg/dL   LDL Chol Calc (NIH) 109 (H) 0 - 99 mg/dL   Chol/HDL Ratio 3.8 0.0 - 4.4 ratio    Comment:                                   T. Chol/HDL Ratio                                             Men  Women                               1/2 Avg.Risk  3.4    3.3                                   Avg.Risk  5.0    4.4                                2X Avg.Risk  9.6    7.1                                3X Avg.Risk 23.4   11.0   Iron and TIBC     Status: Abnormal   Collection Time: 11/30/23  8:21 AM  Result Value Ref Range   Total Iron Binding Capacity 275 250 - 450 ug/dL   UIBC 604 540 - 981 ug/dL   Iron 34 27 - 191 ug/dL   Iron Saturation 12 (L) 15 - 55 %  T4     Status: None   Collection Time: 11/30/23  8:21 AM  Result Value Ref Range   T4, Total 9.1 4.5 - 12.0 ug/dL  T3     Status: None   Collection Time: 11/30/23  8:21 AM  Result Value Ref Range   T3, Total 133 71 - 180 ng/dL  Specimen status report     Status: None   Collection Time: 11/30/23  8:21 AM  Result Value Ref Range   specimen status report Comment     Comment: Written Authorization Written Authorization Written Authorization Received. Authorization received from [employer]  for [employer] on 12-04-2023 Logged by Adria Dill        Assessment &  Plan:   Problem List Items Addressed This Visit       Other   Obesity, morbid (HCC) - Primary   Continue current meds.  Will adjust as needed based on results.  The patient is asked to make an attempt to improve diet and exercise patterns to  aid in medical management of this problem. Addressed importance of increasing and maintaining water intake.        Prediabetes   A1C Continues to be in prediabetic ranges.  Will reassess at follow up after next lab check.  Patient counseled on dietary choices and verbalized understanding.  Patient educated on foods that contain carbohydrates and the need to decrease intake.  We discussed prediabetes, and what it means and the need for strict dietary control to prevent progression to type 2 diabetes.  Advised to decrease intake of sugary drinks, including sodas, sweet tea, and some juices, and of starch and sugar heavy foods (ie., potatoes, rice, bread, pasta, desserts). She verbalizes understanding and agreement with the changes discussed today.        Other Visit Diagnoses       Sebaceous cyst of left axilla       Setting up referral to dermatology for patient for this and for her hair loss. Will defer to them for further changes in treatment.   Relevant Orders   Ambulatory referral to Dermatology     Loss of hair       Relevant Orders   Ambulatory referral to Dermatology       Return in about 1 month (around 01/01/2024).   Total time spent: 20 minutes  Miki Kins, FNP  12/04/2023   This document may have been prepared by [facility] Voice Recognition software and as such may include unintentional dictation errors.

## 2023-12-05 LAB — IRON AND TIBC
Iron Saturation: 12 % — ABNORMAL LOW (ref 15–55)
Iron: 34 ug/dL (ref 27–159)
Total Iron Binding Capacity: 275 ug/dL (ref 250–450)
UIBC: 241 ug/dL (ref 131–425)

## 2023-12-05 LAB — T4: T4, Total: 9.1 ug/dL (ref 4.5–12.0)

## 2023-12-05 LAB — SPECIMEN STATUS REPORT

## 2023-12-05 LAB — T3: T3, Total: 133 ng/dL (ref 71–180)

## 2023-12-10 ENCOUNTER — Encounter: Payer: Self-pay | Admitting: Family

## 2023-12-10 NOTE — Assessment & Plan Note (Signed)

## 2023-12-10 NOTE — Assessment & Plan Note (Signed)
 Continue current meds.  Will adjust as needed based on results.  The patient is asked to make an attempt to improve diet and exercise patterns to aid in medical management of this problem. Addressed importance of increasing and maintaining water intake.

## 2024-01-01 ENCOUNTER — Encounter: Payer: Self-pay | Admitting: Family

## 2024-01-01 ENCOUNTER — Ambulatory Visit: Payer: 59 | Admitting: Family

## 2024-01-01 DIAGNOSIS — K5904 Chronic idiopathic constipation: Secondary | ICD-10-CM

## 2024-01-01 DIAGNOSIS — R7303 Prediabetes: Secondary | ICD-10-CM

## 2024-01-01 DIAGNOSIS — E782 Mixed hyperlipidemia: Secondary | ICD-10-CM

## 2024-01-01 MED ORDER — LINACLOTIDE 145 MCG PO CAPS
145.0000 ug | ORAL_CAPSULE | Freq: Every day | ORAL | 3 refills | Status: AC
Start: 2024-01-01 — End: ?

## 2024-01-01 NOTE — Progress Notes (Signed)
 Established Patient Office Visit  Subjective:  Patient ID: Linda Pearson, female    DOB: May 20, 1981  Age: 43 y.o. MRN: [id number]  Chief Complaint  Patient presents with   Follow-up    1 month follow up    Patient is here today for her 1 month follow up.  She has been feeling fairly well since last appointment.   She does have additional concerns to discuss today.  Has been having issues with her bowels, stool softeners, miralax not helping.  She had been taking Linzess in the past, but even that did not ALWAYS work and it was quite expensive for her.   Labs are not due today. She needs refills.   I have reviewed her active problem list, medication list, allergies, health maintenance, notes from last encounter, lab results for her appointment today.      No other concerns at this time.   Past Medical History:  Diagnosis Date   Anemia    history of anemia- > one yr   History of chlamydia 11/2010   negative in 12/2010   Right ovarian cyst    45x39x64mm dermoid cyst R   Seizures (HCC)    as infant- febrile    Past Surgical History:  Procedure Laterality Date   ganglionic cyst  2007   done twice it grew back   LAPAROSCOPY  02/10/2012   Procedure: LAPAROSCOPY OPERATIVE;  Surgeon: Lavina Hamman, MD;  Location: [facility];  Service: Gynecology;  Laterality: N/A;   SALPINGOOPHORECTOMY  02/10/2012   Procedure: SALPINGO OOPHERECTOMY;  Surgeon: Lavina Hamman, MD;  Location: [facility];  Service: Gynecology;  Laterality: Right;    Social History   Socioeconomic History   Marital status: Single    Spouse name: Not on file   Number of children: Not on file   Years of education: Not on file   Highest education level: Not on file  Occupational History   Not on file  Tobacco Use   Smoking status: Never   Smokeless tobacco: Never  Substance and Sexual Activity   Alcohol use: No    Comment: rarely   Drug use: No   Sexual activity: Yes    Birth control/protection: Implant   Other Topics Concern   Not on file  Social History Narrative   Not on file   Social Drivers of Health   Financial Resource Strain: Not on file  Food Insecurity: Not on file  Transportation Needs: Not on file  Physical Activity: Not on file  Stress: Not on file  Social Connections: Unknown (02/26/2022)   Received [facility]   Social Network    Social Network: Not on file  Intimate Partner Violence: Unknown (01/19/2022)   Received from [facility], [facility]   HITS    Physically Hurt: Not on file    Insult or Talk Down To: Not on file    Threaten Physical Harm: Not on file    Scream or Curse: Not on file    Family History  Problem Relation Age of Onset   Hypertension Mother    Diabetes Father    Heart attack Father    Heart disease Father    Kidney disease Father    Seizures Father    Hypertension Father    Diabetes Maternal Grandmother    Heart attack Maternal Grandmother    Stroke Maternal Grandmother    Hypertension Maternal Grandmother    Diabetes Paternal Grandmother    Diabetes Paternal Actor  Sickle cell trait Cousin    Anesthesia problems Neg Hx    Hypotension Neg Hx    Malignant hyperthermia Neg Hx    Pseudochol deficiency Neg Hx     Allergies  Allergen Reactions   Latex Other (See Comments)    Reaction tingling feeling, burning   Percocet [Oxycodone-Acetaminophen] Itching    Review of Systems  Gastrointestinal:  Positive for abdominal pain and constipation.  All other systems reviewed and are negative.      Objective:   BP 110/76   Pulse (!) 59   Ht 5\' 4"  (1.626 m)   Wt 218 lb (98.9 kg)   SpO2 98%   BMI 37.42 kg/m   Vitals:   01/01/24 1013  BP: 110/76  Pulse: (!) 59  Height: 5\' 4"  (1.626 m)  Weight: 218 lb (98.9 kg)  SpO2: 98%  BMI (Calculated): 37.4    Physical Exam Vitals and nursing note reviewed.  Constitutional:      Appearance: Normal appearance. She is normal weight.  HENT:      Head: Normocephalic.  Eyes:     Extraocular Movements: Extraocular movements intact.     Conjunctiva/sclera: Conjunctivae normal.     Pupils: Pupils are equal, round, and reactive to light.  Cardiovascular:     Rate and Rhythm: Normal rate.  Pulmonary:     Effort: Pulmonary effort is normal.  Neurological:     General: No focal deficit present.     Mental Status: She is alert and oriented to person, place, and time. Mental status is at baseline.  Psychiatric:        Mood and Affect: Mood normal.        Behavior: Behavior normal.        Thought Content: Thought content normal.        Judgment: Judgment normal.      No results found for any visits on 01/01/24.  Recent Results (from the past 2160 hours)  Hemoglobin A1c     Status: Abnormal   Collection Time: 11/30/23  8:21 AM  Result Value Ref Range   Hgb A1c MFr Bld 6.4 (H) 4.8 - 5.6 %    Comment:          Prediabetes: 5.7 - 6.4          Diabetes: >6.4          Glycemic control for adults with diabetes: <7.0    Est. average glucose Bld gHb Est-mCnc 137 mg/dL  TSH     Status: None   Collection Time: 11/30/23  8:21 AM  Result Value Ref Range   TSH 1.120 0.450 - 4.500 uIU/mL  CMP14+EGFR     Status: Abnormal   Collection Time: 11/30/23  8:21 AM  Result Value Ref Range   Glucose 106 (H) 70 - 99 mg/dL   BUN 10 6 - 24 mg/dL   Creatinine, Ser 6.96 0.57 - 1.00 mg/dL   eGFR 80 >29 BM/WUX/3.24   BUN/Creatinine Ratio 11 9 - 23   Sodium 140 134 - 144 mmol/L   Potassium 4.1 3.5 - 5.2 mmol/L   Chloride 102 96 - 106 mmol/L   CO2 23 20 - 29 mmol/L   Calcium 9.6 8.7 - 10.2 mg/dL   Total Protein 6.8 6.0 - 8.5 g/dL   Albumin 4.2 3.9 - 4.9 g/dL   Globulin, Total 2.6 1.5 - 4.5 g/dL   Bilirubin Total 0.2 0.0 - 1.2 mg/dL   Alkaline Phosphatase 60 44 - 121 IU/L  AST 13 0 - 40 IU/L   ALT 13 0 - 32 IU/L  Lipid panel     Status: Abnormal   Collection Time: 11/30/23  8:21 AM  Result Value Ref Range   Cholesterol, Total 175 100 - 199  mg/dL   Triglycerides 97 0 - 149 mg/dL   HDL 46 >01 mg/dL   VLDL Cholesterol Cal 18 5 - 40 mg/dL   LDL Chol Calc (NIH) 601 (H) 0 - 99 mg/dL   Chol/HDL Ratio 3.8 0.0 - 4.4 ratio    Comment:                                   T. Chol/HDL Ratio                                             Men  Women                               1/2 Avg.Risk  3.4    3.3                                   Avg.Risk  5.0    4.4                                2X Avg.Risk  9.6    7.1                                3X Avg.Risk 23.4   11.0   Iron and TIBC     Status: Abnormal   Collection Time: 11/30/23  8:21 AM  Result Value Ref Range   Total Iron Binding Capacity 275 250 - 450 ug/dL   UIBC 093 235 - 573 ug/dL   Iron 34 27 - 220 ug/dL   Iron Saturation 12 (L) 15 - 55 %  T4     Status: None   Collection Time: 11/30/23  8:21 AM  Result Value Ref Range   T4, Total 9.1 4.5 - 12.0 ug/dL  T3     Status: None   Collection Time: 11/30/23  8:21 AM  Result Value Ref Range   T3, Total 133 71 - 180 ng/dL  Specimen status report     Status: None   Collection Time: 11/30/23  8:21 AM  Result Value Ref Range   specimen status report Comment     Comment: Written Authorization Written Authorization Written Authorization Received. Authorization received from [employer]  for [employer] on 12-04-2023 Logged by Adria Dill        Assessment & Plan:   Problem List Items Addressed This Visit       Digestive   Chronic idiopathic constipation   Samples of Linzess given in office today. Will send RX for patient as well.       Relevant Medications   docusate sodium (COLACE) 100 MG capsule     Other   Obesity, morbid (HCC) - Primary   Starting patient on semaglutide for her prediabetes and weight loss.  Will recheck  at follow up.      Prediabetes   A1C Continues to be in prediabetic ranges.  Will reassess at follow up after next lab check.  Patient counseled on dietary choices and verbalized  understanding.  Patient educated on foods that contain carbohydrates and the need to decrease intake.  We discussed prediabetes, and what it means and the need for strict dietary control to prevent progression to type 2 diabetes.  Advised to decrease intake of sugary drinks, including sodas, sweet tea, and some juices, and of starch and sugar heavy foods (ie., potatoes, rice, bread, pasta, desserts). She verbalizes understanding and agreement with the changes discussed today.        Mixed hyperlipidemia   Checking labs today.  Continue current therapy for lipid control. Will modify as needed based on labwork results.         Return in about 3 months (around 04/02/2024) for F/U.   Total time spent: 20 minutes  Miki Kins, FNP  01/01/2024   This document may have been prepared by [employer] Voice Recognition software and as such may include unintentional dictation errors.

## 2024-01-10 ENCOUNTER — Encounter: Payer: Self-pay | Admitting: Family

## 2024-01-12 ENCOUNTER — Other Ambulatory Visit: Payer: Self-pay

## 2024-01-12 MED ORDER — SEMAGLUTIDE(0.25 OR 0.5MG/DOS) 2 MG/3ML ~~LOC~~ SOPN: 0.5000 mg | PEN_INJECTOR | SUBCUTANEOUS | 0 refills | Status: AC

## 2024-01-12 NOTE — Telephone Encounter (Signed)
 Rx was sent to the pharmacy

## 2024-01-14 ENCOUNTER — Encounter: Payer: Self-pay | Admitting: Family

## 2024-01-14 DIAGNOSIS — K5904 Chronic idiopathic constipation: Secondary | ICD-10-CM | POA: Insufficient documentation

## 2024-01-14 DIAGNOSIS — E782 Mixed hyperlipidemia: Secondary | ICD-10-CM | POA: Insufficient documentation

## 2024-01-14 NOTE — Assessment & Plan Note (Signed)
 Checking labs today.  Continue current therapy for lipid control. Will modify as needed based on labwork results.

## 2024-01-14 NOTE — Assessment & Plan Note (Signed)
 Starting patient on semaglutide for her prediabetes and weight loss.  Will recheck at follow up.

## 2024-01-14 NOTE — Assessment & Plan Note (Signed)
 Samples of Linzess given in office today. Will send RX for patient as well.

## 2024-01-14 NOTE — Assessment & Plan Note (Signed)

## 2024-03-14 ENCOUNTER — Other Ambulatory Visit: Payer: Self-pay | Admitting: Cardiology

## 2024-03-14 ENCOUNTER — Other Ambulatory Visit: Payer: Self-pay | Admitting: Family

## 2024-04-02 ENCOUNTER — Ambulatory Visit: Admitting: Family

## 2024-05-20 ENCOUNTER — Other Ambulatory Visit: Payer: Self-pay

## 2024-05-27 ENCOUNTER — Other Ambulatory Visit: Payer: Self-pay | Admitting: Family

## 2024-05-28 MED ORDER — METOPROLOL SUCCINATE ER 25 MG PO TB24: 25.0000 mg | ORAL_TABLET | Freq: Every day | ORAL | 0 refills | Status: AC

## 2024-06-29 ENCOUNTER — Other Ambulatory Visit: Payer: Self-pay | Admitting: Family

## 2024-08-26 ENCOUNTER — Encounter: Payer: Self-pay | Admitting: Dermatology

## 2024-08-26 ENCOUNTER — Ambulatory Visit: Admitting: Dermatology

## 2024-08-26 VITALS — BP 125/82 | HR 83

## 2024-08-26 DIAGNOSIS — L649 Androgenic alopecia, unspecified: Secondary | ICD-10-CM | POA: Diagnosis not present

## 2024-08-26 DIAGNOSIS — L658 Other specified nonscarring hair loss: Secondary | ICD-10-CM | POA: Diagnosis not present

## 2024-08-26 MED ORDER — SAFETY SEAL MISCELLANEOUS MISC: 1.0000 | Freq: Every morning | 6 refills | Status: AC

## 2024-08-26 NOTE — Progress Notes (Signed)
   New Patient Visit  Patient (and/or pt guardian) consented to the use of AI-assisted tools for note generation.    Subjective  Linda Pearson is a 43 y.o. female who presents for the following: Hair loss  Patient states she has hair loss located at the scalp that she would like to have examined. Patient reports the areas have been there for 13 years.  She reports the areas are sometimes itchy She states that the areas have not spread.  Patient reports she has not previously been treated for these areas.  Patient reports she has not used OTC hair loss treatments Patient reports she currently uses Aussie Moist shampoo Patient reports she sometimes uses hair oils and Natures Blessing pomade  Patient reports he does not use tight hairstyles, arely has braids and has not used a relaxer in years  Patient reports she uses a collagen supplement every morning  The following portions of the chart were reviewed this encounter and updated as appropriate: medications, allergies, medical history  Patient would also like to discuss moles on face and would like advice on a scar from her tummy tuck  Review of Systems:  No other skin or systemic complaints except as noted in HPI or Assessment and Plan.  Objective  Well appearing patient in no apparent distress; mood and affect are within normal limits.  A focused examination was performed of the following areas: scalp  Relevant exam findings are noted in the Assessment and Plan.                Assessment & Plan   Androgenetic alopecia and traction alopecia Androgenetic alopecia with hair thinning at the front and middle of the scalp, likely due to genetic predisposition and hormonal changes associated with perimenopause. 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 Discussed the role of DHT and the miniaturization of hair follicles. Explained that treatment aims to counteract hormonal  effects and promote hair growth. Discussed the use of minoxidil and finasteride in a compounded solution to block DHT receptors and stimulate blood flow, enhancing hair growth. Emphasized the importance of daily application to maintain results and the potential for initial shedding as a normal response. Discussed the role of supplements in enhancing treatment efficacy. Advised against daily scalp oiling to prevent dandruff buildup.  - Prescribed compounded solution of minoxidil and finasteride for topical application. - Instructed to apply solution daily using a Q-tip or cotton ball, focusing on affected areas. - Advised to avoid headbands to prevent unwanted hair growth. - Recommended applying solution in the morning to avoid transfer to pillowcases. - Continue current supplements or transition to recommended brands (Viviscal and Vital Proteins collagen). - Advised against daily scalp oiling; recommended overnight or morning application followed by washing. - Scheduled follow-up in six months to assess progress.  ANDROGENETIC ALOPECIA   Related Medications Safety Seal Miscellaneous MISC 1 Application by Does not apply route in the morning. Medication name Hormonic Hair Solution (Minoxidil 8% and Finasteride 0.05%)  Return in about 6 months (around 02/23/2025) for Alopecia F/U.  I, Lyle Cords, as acting as a neurosurgeon for [employer], DO .   Documentation: I have reviewed the above documentation for accuracy and completeness, and I agree with the above.  Delon Lenis, DO

## 2024-08-26 NOTE — Patient Instructions (Addendum)
 VISIT SUMMARY:  Today, we discussed your concerns about hair thinning, particularly at the front and middle of your scalp. We reviewed your family history and current hair care routine, and I provided a treatment plan to address your hair thinning.  YOUR PLAN:  -ANDROGENETIC ALOPECIA AND TRACTION ALOPECIA:  Androgenetic alopecia is a common form of hair loss influenced by genetics and hormones, while traction alopecia is hair loss caused by consistent hair styling patterns.   We discussed using a compounded solution of minoxidil and finasteride to block hormone effects and promote hair growth. You should apply this solution daily with a Q-tip or cotton ball, focusing on the affected areas.   Avoid using headbands to prevent unwanted hair growth and apply the solution in the morning to avoid transfer to pillowcases. Continue your current supplements or consider switching to Viviscal and Vital Proteins collagen. Limit scalp oiling to overnight or morning applications followed by washing.  INSTRUCTIONS:  Please follow up in six months to assess your progress with the treatment plan.          Important Information  Due to recent changes in healthcare laws, you may see results of your pathology and/or laboratory studies on MyChart before the doctors have had a chance to review them. We understand that in some cases there may be results that are confusing or concerning to you. Please understand that not all results are received at the same time and often the doctors may need to interpret multiple results in order to provide you with the best plan of care or course of treatment. Therefore, we ask that you please give us  2 business days to thoroughly review all your results before contacting the office for clarification. Should we see a critical lab result, you will be contacted sooner.   If You Need Anything After Your Visit  If you have any questions or concerns for your doctor, please call  our main line at [phone] If no one answers, please leave a voicemail as directed and we will return your call as soon as possible. Messages left after 4 pm will be answered the following business day.   You may also send us  a message via MyChart. We typically respond to MyChart messages within 1-2 business days.  For prescription refills, please ask your pharmacy to contact our office. Our fax number is [phone].  If you have an urgent issue when the clinic is closed that cannot wait until the next business day, you can page your doctor at the number below.    Please note that while we do our best to be available for urgent issues outside of office hours, we are not available 24/7.   If you have an urgent issue and are unable to reach us , you may choose to seek medical care at your doctor's office, retail clinic, urgent care center, or emergency room.  If you have a medical emergency, please immediately call 911 or go to the emergency department. In the event of inclement weather, please call our main line at [phone] for an update on the status of any delays or closures.  Dermatology Medication Tips: Please keep the boxes that topical medications come in in order to help keep track of the instructions about where and how to use these. Pharmacies typically print the medication instructions only on the boxes and not directly on the medication tubes.   If your medication is too expensive, please contact our office at [phone] or send us  a message through  MyChart.   We are unable to tell what your co-pay for medications will be in advance as this is different depending on your insurance coverage. However, we may be able to find a substitute medication at lower cost or fill out paperwork to get insurance to cover a needed medication.   If a prior authorization is required to get your medication covered by your insurance company, please allow us  1-2 business days to complete this  process.  Drug prices often vary depending on where the prescription is filled and some pharmacies may offer cheaper prices.  The website www.goodrx.com contains coupons for medications through different pharmacies. The prices here do not account for what the cost may be with help from insurance (it may be cheaper with your insurance), but the website can give you the price if you did not use any insurance.  - You can print the associated coupon and take it with your prescription to the pharmacy.  - You may also stop by our office during regular business hours and pick up a GoodRx coupon card.  - If you need your prescription sent electronically to a different pharmacy, notify our office through [facility] or by phone at [phone]

## 2025-03-05 ENCOUNTER — Ambulatory Visit: Admitting: Dermatology
